# Patient Record
Sex: Female | Born: 1945 | Race: White | Hispanic: No | Smoking: Former smoker
Health system: Southern US, Community
[De-identification: ages and names within clinical notes are randomized; demographics above are authoritative.]

## PROBLEM LIST (undated history)

## (undated) DIAGNOSIS — Z9221 Personal history of antineoplastic chemotherapy: Secondary | ICD-10-CM

## (undated) DIAGNOSIS — I10 Essential (primary) hypertension: Secondary | ICD-10-CM

## (undated) DIAGNOSIS — Z923 Personal history of irradiation: Secondary | ICD-10-CM

## (undated) DIAGNOSIS — D649 Anemia, unspecified: Secondary | ICD-10-CM

## (undated) DIAGNOSIS — C801 Malignant (primary) neoplasm, unspecified: Secondary | ICD-10-CM

## (undated) HISTORY — DX: Malignant (primary) neoplasm, unspecified: C80.1

## (undated) HISTORY — DX: Essential (primary) hypertension: I10

## (undated) HISTORY — PX: LOBECTOMY: SHX5089

## (undated) HISTORY — PX: BREAST BIOPSY: SHX20

## (undated) HISTORY — DX: Anemia, unspecified: D64.9

## (undated) HISTORY — PX: BREAST EXCISIONAL BIOPSY: SUR124

---

## 1988-04-24 DIAGNOSIS — C50919 Malignant neoplasm of unspecified site of unspecified female breast: Secondary | ICD-10-CM

## 1988-04-24 HISTORY — DX: Malignant neoplasm of unspecified site of unspecified female breast: C50.919

## 1988-04-24 HISTORY — PX: BREAST LUMPECTOMY: SHX2

## 1993-04-24 HISTORY — PX: CHOLECYSTECTOMY: SHX55

## 2013-04-24 HISTORY — PX: APPENDECTOMY: SHX54

## 2014-04-24 HISTORY — PX: HERNIA REPAIR: SHX51

## 2015-04-25 HISTORY — PX: COLONOSCOPY: SHX174

## 2018-03-08 ENCOUNTER — Ambulatory Visit: Payer: Self-pay | Admitting: Cardiothoracic Surgery

## 2018-03-25 ENCOUNTER — Ambulatory Visit (INDEPENDENT_AMBULATORY_CARE_PROVIDER_SITE_OTHER): Payer: Medicare Other | Admitting: Cardiothoracic Surgery

## 2018-03-25 ENCOUNTER — Other Ambulatory Visit: Payer: Self-pay

## 2018-03-25 ENCOUNTER — Encounter: Payer: Self-pay | Admitting: Cardiothoracic Surgery

## 2018-03-25 VITALS — BP 157/98 | HR 96 | Temp 98.2°F | Ht 67.0 in | Wt 163.0 lb

## 2018-03-25 DIAGNOSIS — IMO0001 Reserved for inherently not codable concepts without codable children: Secondary | ICD-10-CM

## 2018-03-25 DIAGNOSIS — R911 Solitary pulmonary nodule: Secondary | ICD-10-CM

## 2018-03-25 NOTE — Patient Instructions (Addendum)
Return as needed.Ref to Dr. Dahlia Byes for left breast  breast . Get all ct scan and mammogram from [facility].  The patient is aware to call back for any questions or concerns.

## 2018-03-25 NOTE — Progress Notes (Signed)
Patient ID: Monica Baxter, female   DOB: 02/02/1946, 72 y.o.   MRN: [id number]  Chief Complaint  Patient presents with  . Other    Referred By Dr. Fulton Reek Reason for Referral possible recurrence at the previous left thoracotomy or thoracoscopy incision site HPI Location, Quality, Duration, Severity, Timing, Context, Modifying Factors, Associated Signs and Symptoms.  Monica Baxter is a 72 y.o. female.  She has a complicated history which begins about 20 years ago when she had a left lumpectomy followed by radiation therapy for invasive breast carcinoma.  This was in [place].  She had postoperative radiotherapy to that area.  About 10 years ago she underwent a right thoracotomy for a carcinoma of the lung again up in [place].  We do not have any information about those surgical procedures.  About 5 years ago she underwent a thoracoscopic lung resection at [facility] for what she says was a invasive carcinoma of the lung.  I do not have any official reports on any of those procedures.  She has been followed with serial CT scans and recently was found to have increased thickening along a previous surgical resection site on the left.  She initially thought that this was related to her lung surgery but upon reflection this is more related to her breast surgery from 20 years ago.  Regarding her recent lung surgery at [facility] she tolerated that without difficulty.  She is not short of breath.  She really has no specific complaints relative to her incisions.  She recently moved here from the [place] area and would like to transfer her care here.  She saw Dr. Doy Hutching with the most recent CT scan showing thickening along the incision along the left lateral breast which again was initially thought to represent a thoracoscopy scar but now is in the previous breast surgical scar.  She has had mammograms every year.  Those have been performed at [facility]  She is scheduled to have another mammogram done here in about 1  week.   Past Medical History:  Diagnosis Date  . Anemia   . Cancer [facility])    breast   . Hypertension     Past Surgical History:  Procedure Laterality Date  . APPENDECTOMY  2015  . CHOLECYSTECTOMY  1995  . COLONOSCOPY  2017  . HERNIA REPAIR  7893   umbilical   . LOBECTOMY Bilateral 8101,7510    History reviewed. No pertinent family history.  Social History Social History   Tobacco Use  . Smoking status: Former Smoker    Last attempt to quit: 1982    Years since quitting: 37.9  . Smokeless tobacco: Never Used  Substance Use Topics  . Alcohol use: Yes    Frequency: Never  . Drug use: Never    Allergies  Allergen Reactions  . Penicillins Other (See Comments) and Nausea And Vomiting    Unknown reaction as a child   . Codeine Itching    Within a day after initiating Tylenol 3 for cough suppression, she developed nasal itching and some itching in the central facial area. Within a day after initiating Tylenol 3 for cough suppression, she developed nasal itching and some itching in the central facial area.   . Hydromorphone Hcl Itching    Current Outpatient Medications  Medication Sig Dispense Refill  . amLODipine (NORVASC) 5 MG tablet Take by mouth.    . Cholecalciferol 25 MCG (1000 UT) CHEW Chew by mouth.    . gabapentin (NEURONTIN) 600 MG tablet  Take by mouth.    Marland Kitchen omeprazole (PRILOSEC) 20 MG capsule Take by mouth.    . tiotropium (SPIRIVA) 18 MCG inhalation capsule Place into inhaler and inhale.     No current facility-administered medications for this visit.       Review of Systems A complete review of systems was asked and was negative except for the following positive findings some shortness of breath.  Some reflux type symptoms as well as colonic polyps and a hiatal hernia.  Blood pressure (!) 157/98, pulse 96, temperature 98.2 F (36.8 C), temperature source Skin, height 5\' 7"  (1.702 m), weight 163 lb (73.9 kg), SpO2 97 %.  Physical  Exam CONSTITUTIONAL:  Pleasant, well-developed, well-nourished, and in no acute distress. EYES: Pupils equal and reactive to light, Sclera non-icteric EARS, NOSE, MOUTH AND THROAT:  The oropharynx was clear.  Dentition is good repair.  Oral mucosa pink and moist. LYMPH NODES:  Lymph nodes in the neck and axillae were normal RESPIRATORY:  Lungs were clear.  Normal respiratory effort without pathologic use of accessory muscles of respiration CARDIOVASCULAR: Heart was regular without murmurs.  There were no carotid bruits. GI: The abdomen was soft, nontender, and nondistended. There were no palpable masses. There was no hepatosplenomegaly. There were normal bowel sounds in all quadrants. GU:  Rectal deferred.   MUSCULOSKELETAL:  Normal muscle strength and tone.  No clubbing or cyanosis.   SKIN: On the right lateral chest wall there is a posterior lateral thoracotomy incision.  The incision itself looks fine without erythema, nodules or underlying soft tissue changes.  On the left breast superior to the areola is a 5 to 6 cm skin incision with some thickening under the lateral aspect of it.  There are 3 thoracoscopy ports on the left side as well all of which are free of any pathology. NEUROLOGIC:  Sensation is normal.  Cranial nerves are grossly intact. PSYCH:  Oriented to person, place and time.  Mood and affect are normal.  Data Reviewed CT scan  I have personally reviewed the patient's imaging, laboratory findings and medical records.    Assessment/Plan    Initially I was able to review the CT scan from [facility].  Unfortunately I could not see the scan at its full resolution and I only had access to 1 of the scans.  We will therefore get the scans sent to [place] from [facility] to be digitized into our system for further review.  I have explained to her that I thought that the area in question on the CT scan report was actually referring to her breast as that was the only pathologic finding on physical exam.  I  have explained to her that I would like her to see a general surgeon after her mammogram and she is agreeable to doing that.  In addition I would like to see her back again in 1 year so that we can continue to follow the 2 lung cancers that she has had resected.   We will schedule her for repeat chest CT in 1 year with a visit to follow after that.  We will refer her to general surgery for her abnormal breast exam once her mammogram is complete.  Nestor Lewandowsky, MD 03/25/2018, 10:53 AM

## 2018-04-03 ENCOUNTER — Ambulatory Visit
Admission: RE | Admit: 2018-04-03 | Discharge: 2018-04-03 | Disposition: A | Discharge status: Home / Self Care | Payer: Self-pay | Source: Ambulatory Visit | Attending: Cardiothoracic Surgery | Admitting: Cardiothoracic Surgery

## 2018-04-03 ENCOUNTER — Other Ambulatory Visit: Payer: Self-pay | Admitting: Cardiothoracic Surgery

## 2018-04-03 DIAGNOSIS — C3490 Malignant neoplasm of unspecified part of unspecified bronchus or lung: Secondary | ICD-10-CM

## 2018-04-08 ENCOUNTER — Ambulatory Visit: Payer: Medicare Other | Admitting: Surgery

## 2018-04-15 ENCOUNTER — Ambulatory Visit: Payer: Medicare Other | Admitting: Surgery

## 2018-09-20 ENCOUNTER — Other Ambulatory Visit (HOSPITAL_COMMUNITY): Payer: Self-pay | Admitting: Physician Assistant

## 2018-09-20 ENCOUNTER — Other Ambulatory Visit: Payer: Self-pay

## 2018-09-20 ENCOUNTER — Other Ambulatory Visit: Payer: Self-pay | Admitting: Physician Assistant

## 2018-09-20 ENCOUNTER — Ambulatory Visit
Admission: RE | Admit: 2018-09-20 | Discharge: 2018-09-20 | Disposition: A | Discharge status: Home / Self Care | Payer: Medicare Other | Source: Ambulatory Visit | Attending: Physician Assistant | Admitting: Physician Assistant

## 2018-09-20 DIAGNOSIS — R1032 Left lower quadrant pain: Secondary | ICD-10-CM | POA: Insufficient documentation

## 2018-09-20 MED ORDER — IOHEXOL 300 MG/ML  SOLN
100.0000 mL | Freq: Once | INTRAMUSCULAR | Status: AC | PRN
  Administered 2018-09-20: 100 mL via INTRAVENOUS

## 2018-09-25 ENCOUNTER — Ambulatory Visit: Payer: Medicare Other

## 2018-09-27 ENCOUNTER — Other Ambulatory Visit: Payer: Self-pay | Admitting: Internal Medicine

## 2018-09-27 DIAGNOSIS — C3412 Malignant neoplasm of upper lobe, left bronchus or lung: Secondary | ICD-10-CM

## 2018-09-27 DIAGNOSIS — J449 Chronic obstructive pulmonary disease, unspecified: Secondary | ICD-10-CM

## 2018-12-27 ENCOUNTER — Other Ambulatory Visit: Payer: Self-pay

## 2018-12-27 ENCOUNTER — Ambulatory Visit
Admission: RE | Admit: 2018-12-27 | Discharge: 2018-12-27 | Disposition: A | Discharge status: Home / Self Care | Payer: Medicare Other | Source: Ambulatory Visit | Attending: Internal Medicine | Admitting: Internal Medicine

## 2018-12-27 DIAGNOSIS — C3412 Malignant neoplasm of upper lobe, left bronchus or lung: Secondary | ICD-10-CM | POA: Insufficient documentation

## 2018-12-27 DIAGNOSIS — J449 Chronic obstructive pulmonary disease, unspecified: Secondary | ICD-10-CM

## 2019-03-19 ENCOUNTER — Telehealth: Payer: Self-pay

## 2019-03-19 ENCOUNTER — Other Ambulatory Visit: Payer: Self-pay

## 2019-03-19 DIAGNOSIS — R911 Solitary pulmonary nodule: Secondary | ICD-10-CM

## 2019-03-19 DIAGNOSIS — IMO0001 Reserved for inherently not codable concepts without codable children: Secondary | ICD-10-CM

## 2019-03-19 DIAGNOSIS — G8912 Acute post-thoracotomy pain: Secondary | ICD-10-CM

## 2019-03-19 NOTE — Telephone Encounter (Signed)
CT chest scheduled at [facility] 03/24/2019. Nothing by mouth 4 hours prior.   Dr.Oaks 03/28/2019 @ 9:45.  Patient verbalized understanding of the appointments listed above.

## 2019-03-24 ENCOUNTER — Ambulatory Visit: Payer: Medicare Other

## 2019-03-28 ENCOUNTER — Ambulatory Visit: Payer: Medicare Other | Admitting: Cardiothoracic Surgery

## 2019-04-01 ENCOUNTER — Ambulatory Visit: Payer: Medicare Other

## 2019-04-04 ENCOUNTER — Telehealth: Payer: Medicare Other | Admitting: Cardiothoracic Surgery

## 2019-07-18 ENCOUNTER — Ambulatory Visit: Payer: Medicare Other | Admitting: Urology

## 2019-08-06 ENCOUNTER — Encounter (INDEPENDENT_AMBULATORY_CARE_PROVIDER_SITE_OTHER): Payer: Self-pay

## 2019-08-06 ENCOUNTER — Encounter: Payer: Self-pay | Admitting: Urology

## 2019-08-06 ENCOUNTER — Other Ambulatory Visit: Payer: Self-pay

## 2019-08-06 ENCOUNTER — Ambulatory Visit (INDEPENDENT_AMBULATORY_CARE_PROVIDER_SITE_OTHER): Payer: Medicare Other | Admitting: Urology

## 2019-08-06 VITALS — BP 153/84 | HR 108 | Ht 66.0 in | Wt 155.0 lb

## 2019-08-06 DIAGNOSIS — R399 Unspecified symptoms and signs involving the genitourinary system: Secondary | ICD-10-CM | POA: Diagnosis not present

## 2019-08-06 DIAGNOSIS — R8271 Bacteriuria: Secondary | ICD-10-CM | POA: Diagnosis not present

## 2019-08-06 DIAGNOSIS — N393 Stress incontinence (female) (male): Secondary | ICD-10-CM | POA: Diagnosis not present

## 2019-08-06 LAB — URINALYSIS, COMPLETE
Bilirubin, UA: NEGATIVE
Glucose, UA: NEGATIVE
Ketones, UA: NEGATIVE
Leukocytes,UA: NEGATIVE
Nitrite, UA: NEGATIVE
Protein,UA: NEGATIVE
RBC, UA: NEGATIVE
Specific Gravity, UA: 1.01 (ref 1.005–1.030)
Urobilinogen, Ur: 0.2 mg/dL (ref 0.2–1.0)
pH, UA: 5.5 (ref 5.0–7.5)

## 2019-08-06 LAB — BLADDER SCAN AMB NON-IMAGING: Scan Result: 0

## 2019-08-06 LAB — MICROSCOPIC EXAMINATION
Bacteria, UA: NONE SEEN
Epithelial Cells (non renal): NONE SEEN /hpf (ref 0–10)
RBC, Urine: NONE SEEN /hpf (ref 0–2)

## 2019-08-06 NOTE — Progress Notes (Signed)
08/06/19 9:52 AM   Monica Baxter [birth year] [id number]  Referring provider: Idelle Crouch, MD [facility] [place],  [place] [address]  Chief Complaint  Patient presents with  . Urinary Tract Infection    HPI: Monica Baxter is a 74 y.o. F who presents today for the evaluation and management of rUTIs.   PCP visit with Dr. Doy Hutching on 05/15/19 where she had bacteria in urine however had no UTI symptoms.   Most recent UA form 06/30/19 revealed rare bacteria otherwise unremarkable. UAs from 03/2019 and 08/2018 indicated positive nitrite and many bacteria. Associated positive urine cultures below.   She was treated with Cipro.   She reports of previous UTIs with no symptoms. Denies burning with urination.  She also reports of mild leakage when coughing, sneezing, and laughing. Her pads are not saturated. This is somewhat bothersome and new since last year.   + Urine culture 03/31/2019 indicative of K. Pneumoniae resistant to Ampicillin  03/05/2018 indicative of E. Coli   PMH: Past Medical History:  Diagnosis Date  . Anemia   . Cancer [facility])    breast   . Hypertension     Surgical History: Past Surgical History:  Procedure Laterality Date  . APPENDECTOMY  2015  . CHOLECYSTECTOMY  1995  . COLONOSCOPY  2017  . HERNIA REPAIR  4287   umbilical   . LOBECTOMY Bilateral 2002,2015    Home Medications:  Allergies as of 08/06/2019      Reactions   Penicillins Other (See Comments), Nausea And Vomiting   Unknown reaction as a child   Codeine Itching   Within a day after initiating Tylenol 3 for cough suppression, she developed nasal itching and some itching in the central facial area. Within a day after initiating Tylenol 3 for cough suppression, she developed nasal itching and some itching in the central facial area.   Hydromorphone Hcl Itching      Medication List       Accurate as of August 06, 2019 11:59 PM. If you have any questions,  ask your nurse or doctor.        amLODipine 10 MG tablet Commonly known as: NORVASC What changed: Another medication with the same name was removed. Continue taking this medication, and follow the directions you see here. Changed by: Hollice Espy, MD   Cholecalciferol 25 MCG (1000 UT) Chew Chew by mouth.   gabapentin 600 MG tablet Commonly known as: NEURONTIN Take by mouth.   omeprazole 20 MG capsule Commonly known as: PRILOSEC Take by mouth.   raloxifene 60 MG tablet Commonly known as: EVISTA   solifenacin 5 MG tablet Commonly known as: VESICARE   tiotropium 18 MCG inhalation capsule Commonly known as: [place] into inhaler and inhale.       Allergies:  Allergies  Allergen Reactions  . Penicillins Other (See Comments) and Nausea And Vomiting    Unknown reaction as a child   . Codeine Itching    Within a day after initiating Tylenol 3 for cough suppression, she developed nasal itching and some itching in the central facial area. Within a day after initiating Tylenol 3 for cough suppression, she developed nasal itching and some itching in the central facial area.   . Hydromorphone Hcl Itching    Family History: Family History  Adopted: Yes  Family history unknown: Yes    Social History:  reports that she quit smoking about 39 years ago. She has never used smokeless tobacco.  She reports current alcohol use. She reports that she does not use drugs.   Physical Exam: BP (!) 153/84   Pulse (!) 108   Ht 5\' 6"  (1.676 m)   Wt 155 lb (70.3 kg)   BMI 25.02 kg/m   Constitutional:  Alert and oriented, No acute distress. HEENT: [place] AT, moist mucus membranes.  Trachea midline, no masses. Cardiovascular: No clubbing, cyanosis, or edema. Respiratory: Normal respiratory effort, no increased work of breathing. Skin: No rashes, bruises or suspicious lesions. Neurologic: Grossly intact, no focal deficits, moving all 4 extremities. Psychiatric: Normal mood and  affect.  Pertinent Imaging: Results for orders placed or performed in visit on 08/06/19  Microscopic Examination   URINE  Result Value Ref Range   WBC, UA 0-5 0 - 5 /hpf   RBC None seen 0 - 2 /hpf   Epithelial Cells (non renal) None seen 0 - 10 /hpf   Bacteria, UA None seen None seen/Few  Urinalysis, Complete  Result Value Ref Range   Specific Gravity, UA 1.010 1.005 - 1.030   pH, UA 5.5 5.0 - 7.5   Color, UA Yellow Yellow   Appearance Ur Clear Clear   Leukocytes,UA Negative Negative   Protein,UA Negative Negative/Trace   Glucose, UA Negative Negative   Ketones, UA Negative Negative   RBC, UA Negative Negative   Bilirubin, UA Negative Negative   Urobilinogen, Ur 0.2 0.2 - 1.0 mg/dL   Nitrite, UA Negative Negative   Microscopic Examination See below:   BLADDER SCAN AMB NON-IMAGING  Result Value Ref Range   Scan Result 0 ML    Assessment & Plan:    1. Chronic intermittent asymptomatic bacteriuria  Asymptomatic  Treat for symptoms only -spent a significant amount of time counseling the patient Counseled pt on prevention techniques including probiotics and cranberry tablets  Advised to return to our office specifically if she has any UTI type symptoms F/u prn   2. Stress incontinence  Adequate emptying of bladder  Discussed conservative and behavioral modifications including Kegel exercises and weight loss She may may be interested in physical therapy referral if she is unable to do these exercises independently, she will let us know Not bother her enough to consider procedure versus surgical intervention at this time  Return if symptoms worsen or fail to improve.  [facility] [address] [place], [place] [address] [phone]  I, Lucas Mallow, am acting as a scribe for Dr. Hollice Espy,  I have reviewed the above documentation for accuracy and completeness, and I agree with the above.   Hollice Espy, MD   I  spent 45 total minutes on the day of the encounter including pre-visit review of the medical record, face-to-face time with the patient, and post visit ordering of labs/imaging/tests.

## 2019-08-06 NOTE — Patient Instructions (Signed)
Pelvic Floor Muscle Exercises  More commonly called "Kegel" exercises: they can strengthen the muscles that hold urine inside the bladder.  Here's how you do them:   - Imagine that you are trying to control passing gas   - Pull in or tighten your pelvic muscles and hold for a count of 3 (you should feel a lifting sensation in the area around your vagina or pulling in your rectum)   - Repeat 10 to 15 times, at least 3 times a day   - Each time you do these exercises, alternate your position between lying, sitting and standing  Talk to you health care professional to make sure you are doing the exercise the right way.     Lie with hips and knees bent.  On an exhale, squeeze anal sphincter and hold for 3 seconds.  Release and fully relax pelvic floor.  Repeat 3 times.  Perform one set each hour during the day.  Also in this position, lie with hips and knees bent.  Slowly inhale, and then exhale.  On the exhale, contract anal sphincter, pull navel to spine and contract buttocks.  Hold for 5 seconds.  Repeat 5 times.  Perform one set each hour of the day in either lying, seated or standing.  While still laying lie with hips and knees bent. Slowly inhale and then exhale while pulling navel toward spine, holding 5 seconds.  Relax, repeat 5 times.  Perform one set each hour daytime in any position lying seated or standing.  Sitting on an exhale squeeze anal sphincter and hold for 3 seconds.  Release and fully relax pelvic floor.  Repeat 3 times.  Perform one set each hour daytime.  Standing on an exhale squeeze anal sphincter and hold for 3 seconds.  Release and fully relax pelvic floor.  Repeat 3 times.  Perform one set each hour daytime.

## 2019-11-02 ENCOUNTER — Encounter: Payer: Self-pay | Admitting: Emergency Medicine

## 2019-11-02 ENCOUNTER — Other Ambulatory Visit: Payer: Self-pay

## 2019-11-02 ENCOUNTER — Emergency Department
Admission: EM | Admit: 2019-11-02 | Discharge: 2019-11-02 | Disposition: A | Discharge status: Home / Self Care | Payer: Medicare Other | Attending: Emergency Medicine | Admitting: Emergency Medicine

## 2019-11-02 ENCOUNTER — Emergency Department: Payer: Medicare Other

## 2019-11-02 DIAGNOSIS — R079 Chest pain, unspecified: Secondary | ICD-10-CM | POA: Diagnosis not present

## 2019-11-02 DIAGNOSIS — Z5321 Procedure and treatment not carried out due to patient leaving prior to being seen by health care provider: Secondary | ICD-10-CM | POA: Insufficient documentation

## 2019-11-02 LAB — CBC
HCT: 35.5 % — ABNORMAL LOW (ref 36.0–46.0)
Hemoglobin: 11.8 g/dL — ABNORMAL LOW (ref 12.0–15.0)
MCH: 27.8 pg (ref 26.0–34.0)
MCHC: 33.2 g/dL (ref 30.0–36.0)
MCV: 83.5 fL (ref 80.0–100.0)
Platelets: 339 10*3/uL (ref 150–400)
RBC: 4.25 MIL/uL (ref 3.87–5.11)
RDW: 13.1 % (ref 11.5–15.5)
WBC: 7.1 10*3/uL (ref 4.0–10.5)
nRBC: 0 % (ref 0.0–0.2)

## 2019-11-02 LAB — BASIC METABOLIC PANEL
Anion gap: 8 (ref 5–15)
BUN: 16 mg/dL (ref 8–23)
CO2: 29 mmol/L (ref 22–32)
Calcium: 9.3 mg/dL (ref 8.9–10.3)
Chloride: 104 mmol/L (ref 98–111)
Creatinine, Ser: 0.64 mg/dL (ref 0.44–1.00)
GFR calc Af Amer: >60 mL/min (ref >60)
GFR calc non Af Amer: >60 mL/min (ref >60)
Glucose, Bld: 113 mg/dL — ABNORMAL HIGH (ref 70–99)
Potassium: 4 mmol/L (ref 3.5–5.1)
Sodium: 141 mmol/L (ref 135–145)

## 2019-11-02 LAB — TROPONIN I (HIGH SENSITIVITY): Troponin I (High Sensitivity): 2 ng/L (ref <18)

## 2019-11-02 NOTE — ED Triage Notes (Signed)
Pt arrives POV to triage with c/o chest pain since midnight. Pt states that she had pain in June but was able to increase her Prilosec and help the pain. Pt states that she has a lot of gas and thinks that this is probably acid reflux related.

## 2019-11-02 NOTE — ED Notes (Signed)
Pt informed registration desk she would be leaving and that she felt better and will follow up with PCP. This RN did not speak with patient prior to departure.

## 2020-01-20 ENCOUNTER — Inpatient Hospital Stay
Admission: RE | Admit: 2020-01-20 | Discharge: 2020-01-20 | Disposition: A | Discharge status: Home / Self Care | Payer: Self-pay | Source: Ambulatory Visit | Attending: *Deleted | Admitting: *Deleted

## 2020-01-20 ENCOUNTER — Other Ambulatory Visit: Payer: Self-pay | Admitting: *Deleted

## 2020-01-20 DIAGNOSIS — Z1231 Encounter for screening mammogram for malignant neoplasm of breast: Secondary | ICD-10-CM

## 2020-02-03 ENCOUNTER — Other Ambulatory Visit: Payer: Self-pay | Admitting: Internal Medicine

## 2020-02-03 DIAGNOSIS — Z1231 Encounter for screening mammogram for malignant neoplasm of breast: Secondary | ICD-10-CM

## 2020-05-14 ENCOUNTER — Other Ambulatory Visit: Payer: Self-pay

## 2020-05-14 ENCOUNTER — Ambulatory Visit
Admission: RE | Admit: 2020-05-14 | Discharge: 2020-05-14 | Disposition: A | Discharge status: Home / Self Care | Payer: Medicare Other | Source: Ambulatory Visit | Attending: Internal Medicine | Admitting: Internal Medicine

## 2020-05-14 DIAGNOSIS — Z1231 Encounter for screening mammogram for malignant neoplasm of breast: Secondary | ICD-10-CM | POA: Insufficient documentation

## 2020-05-14 HISTORY — DX: Personal history of irradiation: Z92.3

## 2020-05-14 HISTORY — DX: Personal history of antineoplastic chemotherapy: Z92.21

## 2020-05-20 ENCOUNTER — Other Ambulatory Visit: Payer: Self-pay | Admitting: Internal Medicine

## 2020-05-20 DIAGNOSIS — N6489 Other specified disorders of breast: Secondary | ICD-10-CM

## 2020-05-20 DIAGNOSIS — R928 Other abnormal and inconclusive findings on diagnostic imaging of breast: Secondary | ICD-10-CM

## 2020-05-31 ENCOUNTER — Other Ambulatory Visit: Payer: Self-pay

## 2020-05-31 ENCOUNTER — Ambulatory Visit
Admission: RE | Admit: 2020-05-31 | Discharge: 2020-05-31 | Disposition: A | Discharge status: Home / Self Care | Payer: Medicare Other | Source: Ambulatory Visit | Attending: Internal Medicine | Admitting: Internal Medicine

## 2020-05-31 DIAGNOSIS — R928 Other abnormal and inconclusive findings on diagnostic imaging of breast: Secondary | ICD-10-CM | POA: Diagnosis not present

## 2020-05-31 DIAGNOSIS — N6489 Other specified disorders of breast: Secondary | ICD-10-CM

## 2021-03-29 ENCOUNTER — Other Ambulatory Visit: Payer: Self-pay | Admitting: Internal Medicine

## 2021-03-29 DIAGNOSIS — Z1231 Encounter for screening mammogram for malignant neoplasm of breast: Secondary | ICD-10-CM

## 2021-06-06 ENCOUNTER — Ambulatory Visit
Admission: RE | Admit: 2021-06-06 | Discharge: 2021-06-06 | Disposition: A | Discharge status: Home / Self Care | Payer: Medicare Other | Source: Ambulatory Visit | Attending: Internal Medicine | Admitting: Internal Medicine

## 2021-06-06 ENCOUNTER — Other Ambulatory Visit: Payer: Self-pay

## 2021-06-06 DIAGNOSIS — Z1231 Encounter for screening mammogram for malignant neoplasm of breast: Secondary | ICD-10-CM | POA: Insufficient documentation

## 2021-06-09 IMAGING — MG MM DIGITAL DIAGNOSTIC UNILAT*L* W/ TOMO W/ CAD
6 of 10 series · 6 of 30 positions shown · non-contrast
Comparison: Previous exam(s).

CLINICAL DATA: Patient was recalled from screening mammogram for
possible asymmetry in the left breast.

EXAM:
DIGITAL DIAGNOSTIC UNILATERAL LEFT MAMMOGRAM WITH TOMOSYNTHESIS AND
CAD
TECHNIQUE: Left digital diagnostic mammography and breast tomosynthesis was
performed. The images were evaluated with computer-aided detection.

[L MLO synth-2D (1 of 3)]
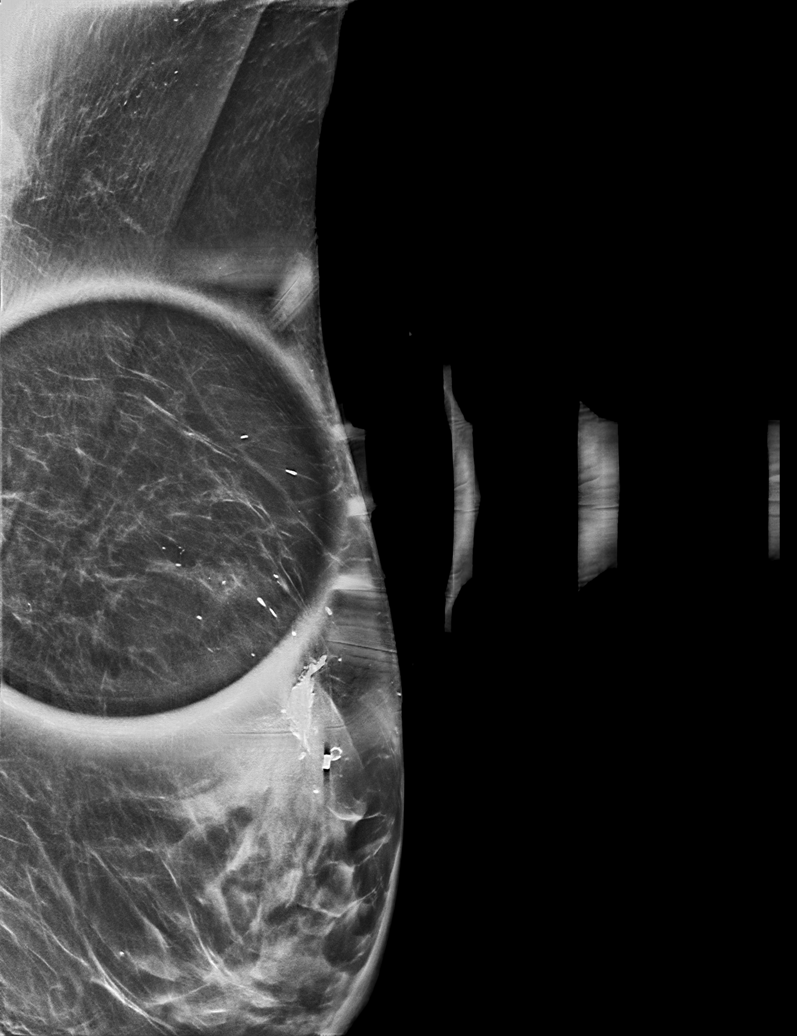

[L MLO synth-2D (2 of 3)]
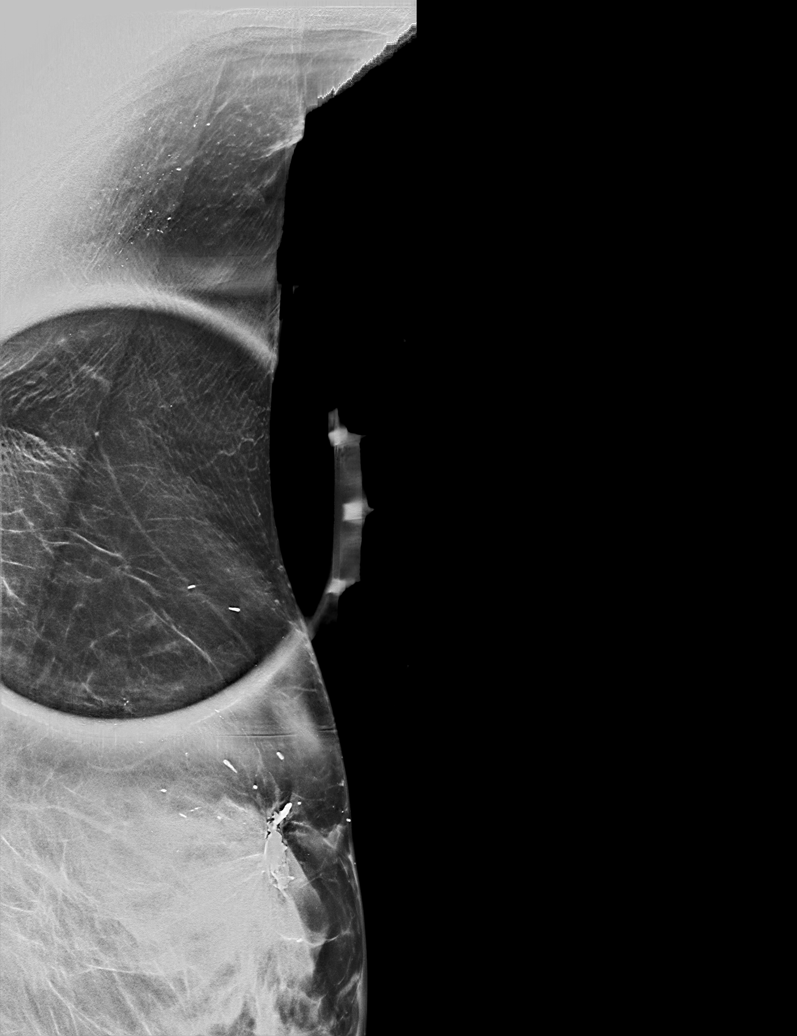

[L LM synth-2D]
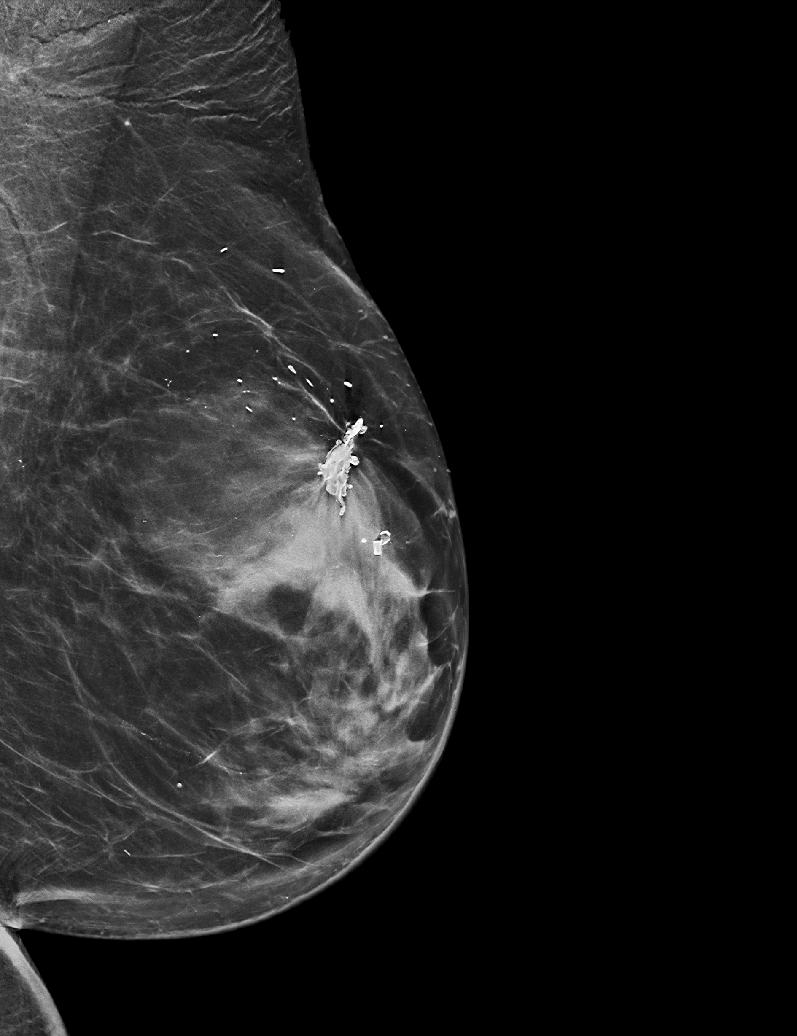

[L MLO synth-2D (3 of 3)]
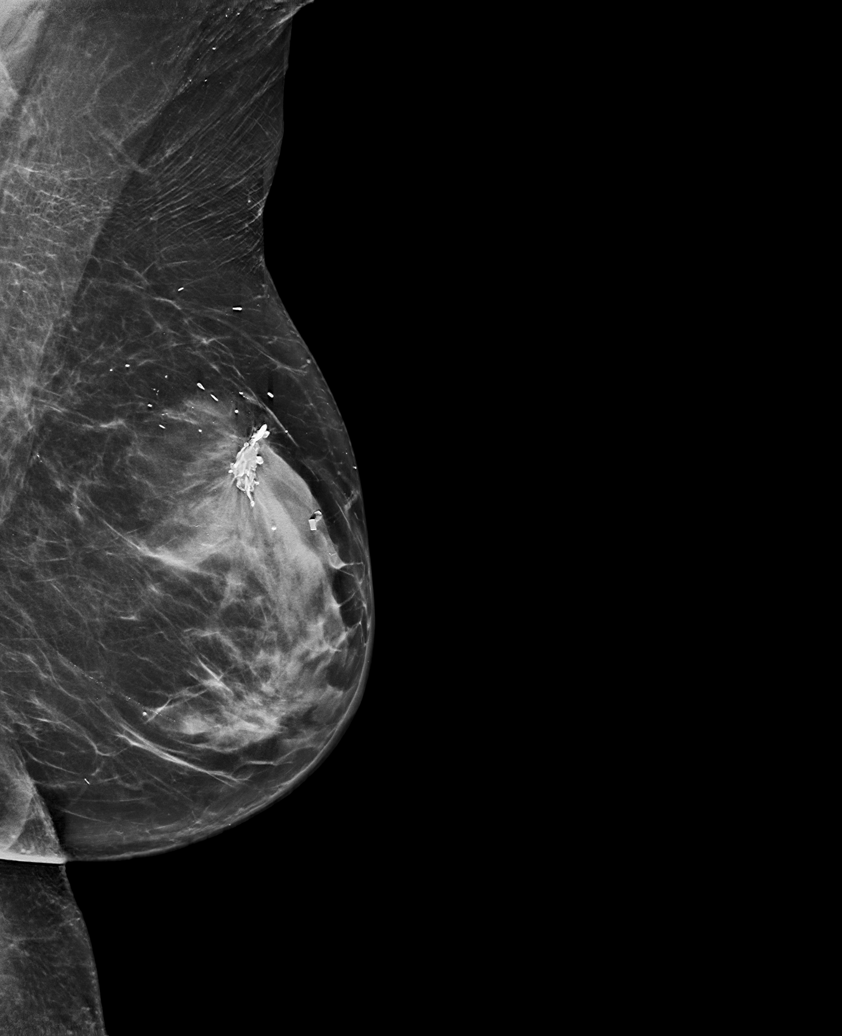

[L ML synth-2D]
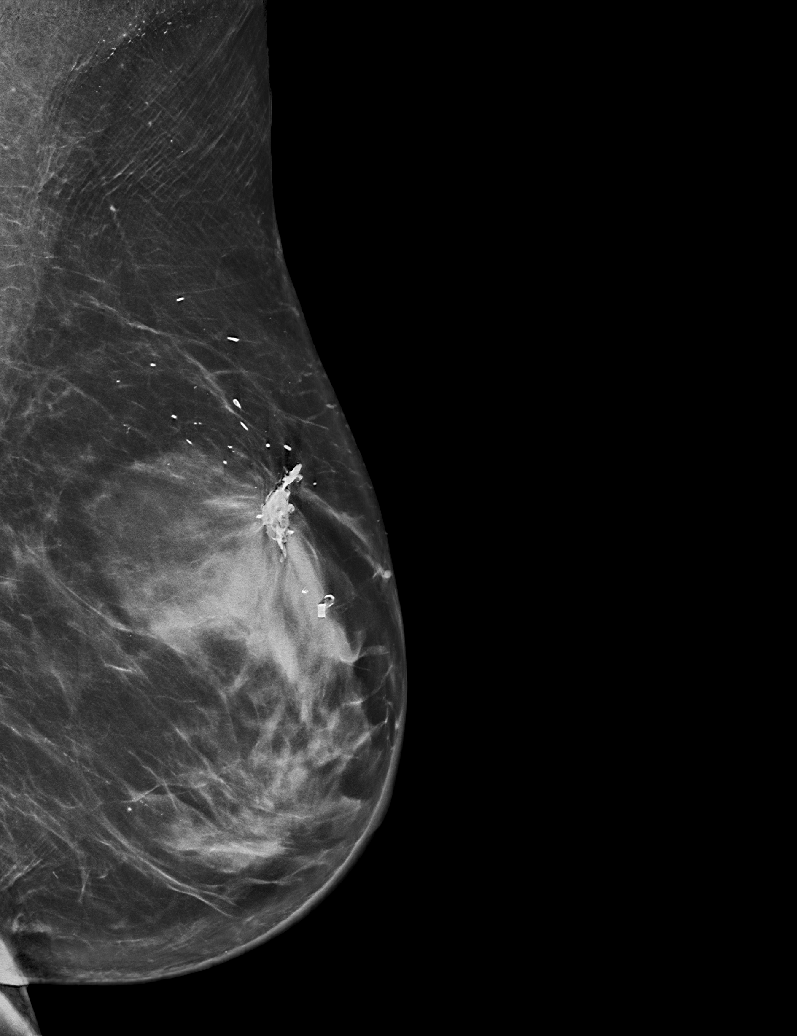

[L LM tomo · tomo slice 35/68.0]
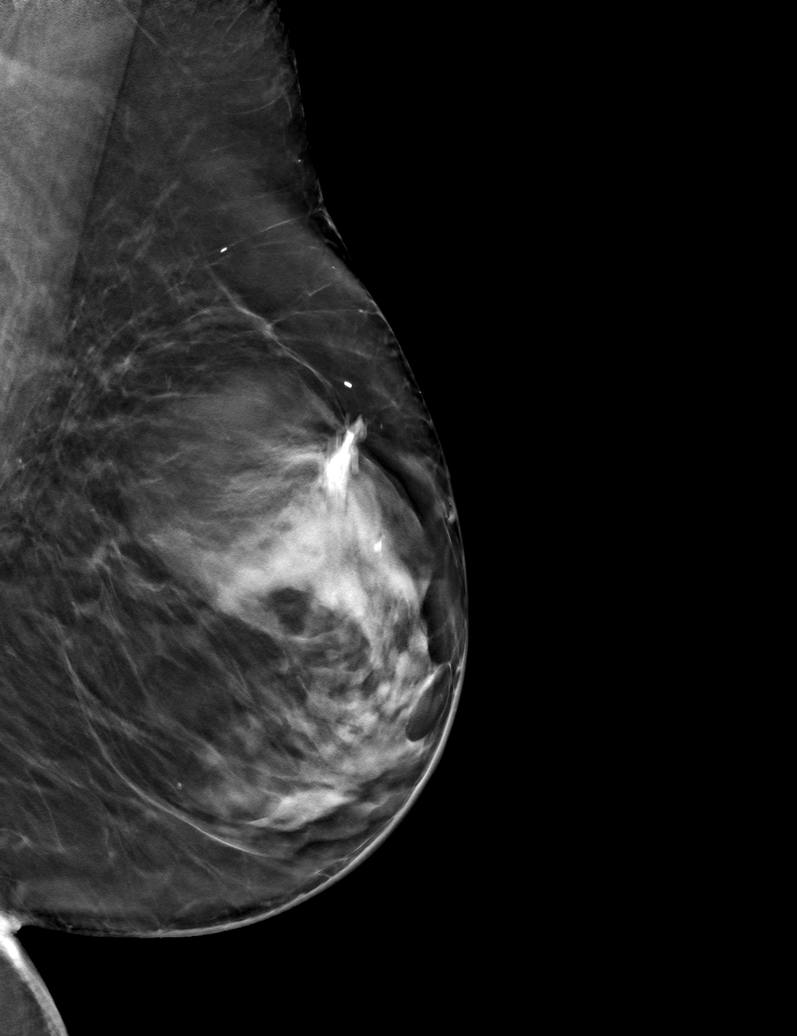

[6 of 30 positions shown; findings below may reference images not displayed]

ACR Breast Density Category c: The breast tissue is heterogeneously
dense, which may obscure small masses.
FINDINGS: Stable lumpectomy changes are seen in the left breast. The asymmetry
in the superior aspect of the breast does not persist on the
additional imaging. No suspicious mass, malignant type
microcalcifications or distortion detected.
IMPRESSION: No evidence of malignancy in the left breast.

RECOMMENDATION:
Bilateral screening mammogram in 1 year is recommended.

I have discussed the findings and recommendations with the patient.
If applicable, a reminder letter will be sent to the patient
regarding the next appointment.

BI-RADS CATEGORY  2: Benign.

## 2022-03-21 ENCOUNTER — Other Ambulatory Visit: Payer: Self-pay | Admitting: Internal Medicine

## 2022-03-21 DIAGNOSIS — Z1231 Encounter for screening mammogram for malignant neoplasm of breast: Secondary | ICD-10-CM

## 2022-04-03 ENCOUNTER — Inpatient Hospital Stay: Payer: Medicare Other

## 2022-04-03 ENCOUNTER — Inpatient Hospital Stay: Payer: Medicare Other | Attending: Internal Medicine | Admitting: Internal Medicine

## 2022-04-03 ENCOUNTER — Encounter: Payer: Self-pay | Admitting: Internal Medicine

## 2022-04-03 VITALS — BP 146/89 | HR 87 | Temp 97.1°F | Wt 156.0 lb

## 2022-04-03 DIAGNOSIS — I10 Essential (primary) hypertension: Secondary | ICD-10-CM | POA: Diagnosis not present

## 2022-04-03 DIAGNOSIS — D696 Thrombocytopenia, unspecified: Secondary | ICD-10-CM

## 2022-04-03 DIAGNOSIS — Z87891 Personal history of nicotine dependence: Secondary | ICD-10-CM | POA: Diagnosis not present

## 2022-04-03 DIAGNOSIS — D649 Anemia, unspecified: Secondary | ICD-10-CM | POA: Insufficient documentation

## 2022-04-03 DIAGNOSIS — Z9221 Personal history of antineoplastic chemotherapy: Secondary | ICD-10-CM | POA: Diagnosis not present

## 2022-04-03 DIAGNOSIS — Z79899 Other long term (current) drug therapy: Secondary | ICD-10-CM | POA: Diagnosis not present

## 2022-04-03 DIAGNOSIS — Z923 Personal history of irradiation: Secondary | ICD-10-CM | POA: Insufficient documentation

## 2022-04-03 DIAGNOSIS — Z85118 Personal history of other malignant neoplasm of bronchus and lung: Secondary | ICD-10-CM | POA: Diagnosis not present

## 2022-04-03 DIAGNOSIS — Z853 Personal history of malignant neoplasm of breast: Secondary | ICD-10-CM | POA: Insufficient documentation

## 2022-04-03 DIAGNOSIS — Z9012 Acquired absence of left breast and nipple: Secondary | ICD-10-CM | POA: Diagnosis not present

## 2022-04-03 LAB — CBC WITH DIFFERENTIAL/PLATELET
Abs Immature Granulocytes: 0.02 10*3/uL (ref 0.00–0.07)
Basophils Absolute: 0 10*3/uL (ref 0.0–0.1)
Basophils Relative: 1 %
Eosinophils Absolute: 0.3 10*3/uL (ref 0.0–0.5)
Eosinophils Relative: 5 %
HCT: 35.3 % — ABNORMAL LOW (ref 36.0–46.0)
Hemoglobin: 11.5 g/dL — ABNORMAL LOW (ref 12.0–15.0)
Immature Granulocytes: 0 %
Lymphocytes Relative: 30 %
Lymphs Abs: 1.7 10*3/uL (ref 0.7–4.0)
MCH: 27.4 pg (ref 26.0–34.0)
MCHC: 32.6 g/dL (ref 30.0–36.0)
MCV: 84 fL (ref 80.0–100.0)
Monocytes Absolute: 0.4 10*3/uL (ref 0.1–1.0)
Monocytes Relative: 7 %
Neutro Abs: 3.2 10*3/uL (ref 1.7–7.7)
Neutrophils Relative %: 57 %
Platelets: 309 10*3/uL (ref 150–400)
RBC: 4.2 MIL/uL (ref 3.87–5.11)
RDW: 13.2 % (ref 11.5–15.5)
WBC: 5.6 10*3/uL (ref 4.0–10.5)
nRBC: 0 % (ref 0.0–0.2)

## 2022-04-03 LAB — IRON AND TIBC
Iron: 59 ug/dL (ref 28–170)
Saturation Ratios: 15 % (ref 10.4–31.8)
TIBC: 382 ug/dL (ref 250–450)
UIBC: 323 ug/dL

## 2022-04-03 LAB — TECHNOLOGIST SMEAR REVIEW: Plt Morphology: ADEQUATE

## 2022-04-03 LAB — RETICULOCYTES
Immature Retic Fract: 8.3 % (ref 2.3–15.9)
RBC.: 4.19 MIL/uL (ref 3.87–5.11)
Retic Count, Absolute: 60.8 10*3/uL (ref 19.0–186.0)
Retic Ct Pct: 1.5 % (ref 0.4–3.1)

## 2022-04-03 LAB — LACTATE DEHYDROGENASE: LDH: 118 U/L (ref 98–192)

## 2022-04-03 LAB — FERRITIN: Ferritin: 23 ng/mL (ref 11–307)

## 2022-04-03 LAB — FOLATE: Folate: 13.9 ng/mL (ref >5.9)

## 2022-04-03 LAB — VITAMIN B12: Vitamin B-12: 594 pg/mL (ref 180–914)

## 2022-04-03 NOTE — Progress Notes (Signed)
Patient here as new patient for Anemia. Per patient her recent labs showed her hgb was low. Patient states she always tired and feet stay cold. Per patient she has little energy. Patient wanting Dr. Sharmaine Base opinion of labs.

## 2022-04-03 NOTE — Progress Notes (Signed)
[facility] NOTE  Patient Care Team: Idelle Crouch, MD as PCP - General (Internal Medicine)  CHIEF COMPLAINTS/PURPOSE OF CONSULTATION: ANEMIA   HEMATOLOGY HISTORY  # 1990- left breast cancer- s/p lumpectomy; chemo- RT; no pills [ER- NEG]; Orp[facility] Greek.   # Right Upper Lung cancer [2000] s/p surgery- Orpah Greek- No chemo- RT [Stage I]; remote smoking..   # ANEMIA[Hb; MCV-platelets- WBC; Iron sat; ferritin;  GFR- CT/US; EGD- none/colonoscopy-2023-Feb- NEG  HISTORY OF PRESENTING ILLNESS: Alone. Ambulating independently.  Monica Baxter 76 y.o.  female pleasant patient was been referred to [place] for further evaluation of anemia.  Patient admits to chronic mild fatigue.  Blood in stools:none Blood in urine:none Difficulty swallowing:none Change of bowel movement/constipation:none Prior blood transfusion:none Prior history of blood loss: none Liver disease:none Alcohol: none Bariatric surgery:none  Vaginal bleeding: none Prior evaluation with hematology: none Prior bone marrow biopsy: none Oral iron: none Prior IV iron infusions:none   Review of Systems  Constitutional:  Positive for malaise/fatigue. Negative for chills, diaphoresis, fever and weight loss.  HENT:  Negative for nosebleeds and sore throat.   Eyes:  Negative for double vision.  Respiratory:  Negative for cough, hemoptysis, sputum production, shortness of breath and wheezing.   Cardiovascular:  Negative for chest pain, palpitations, orthopnea and leg swelling.  Gastrointestinal:  Negative for abdominal pain, blood in stool, constipation, diarrhea, heartburn, melena, nausea and vomiting.  Genitourinary:  Negative for dysuria, frequency and urgency.  Musculoskeletal:  Negative for back pain and joint pain.  Skin: Negative.  Negative for itching and rash.  Neurological:  Negative for dizziness, tingling, focal weakness, weakness and headaches.  Endo/Heme/Allergies:  Does not bruise/bleed  easily.  Psychiatric/Behavioral:  Negative for depression. The patient is not nervous/anxious and does not have insomnia.     MEDICAL HISTORY:  Past Medical History:  Diagnosis Date   Anemia    Breast cancer ([place]) 1990   left   Cancer ([place])    breast    Hypertension    Personal history of chemotherapy    Personal history of radiation therapy     SURGICAL HISTORY: Past Surgical History:  Procedure Laterality Date   APPENDECTOMY  2015   BREAST BIOPSY     BREAST EXCISIONAL BIOPSY     BREAST LUMPECTOMY Left [place]   COLONOSCOPY  2017   HERNIA REPAIR  2542   umbilical    LOBECTOMY Bilateral 2002,2015    SOCIAL HISTORY: Social History   Socioeconomic History   Marital status: Married    Spouse name: Not on file   Number of children: Not on file   Years of education: Not on file   Highest education level: Not on file  Occupational History   Not on file  Tobacco Use   Smoking status: Former    Types: Cigarettes    Quit date: 1982    Years since quitting: 41.9   Smokeless tobacco: Never  Substance and Sexual Activity   Alcohol use: Yes    Alcohol/week: 2.0 standard drinks of alcohol    Types: 2 Glasses of wine per week   Drug use: Never   Sexual activity: Not on file  Other Topics Concern   Not on file  Social History Narrative   Not on file   Social Determinants of Health   Financial Resource Strain: Not on file  Food Insecurity: Not on file  Transportation Needs: Not on file  Physical Activity: Not on file  Stress: Not on file  Social Connections: Not on file  Intimate Partner Violence: Not on file    FAMILY HISTORY: Family History  Adopted: Yes  Family history unknown: Yes    ALLERGIES:  is allergic to penicillins, codeine, and hydromorphone hcl.  MEDICATIONS:  Current Outpatient Medications  Medication Sig Dispense Refill   amLODipine (NORVASC) 10 MG tablet      Cholecalciferol 25 MCG (1000 UT) CHEW Chew by mouth.      gabapentin (NEURONTIN) 600 MG tablet Take by mouth.     omeprazole (PRILOSEC) 20 MG capsule Take by mouth.     raloxifene (EVISTA) 60 MG tablet      solifenacin (VESICARE) 5 MG tablet      tiotropium (SPIRIVA) 18 MCG inhalation capsule Place into inhaler and inhale.     No current facility-administered medications for this visit.     Marland Kitchen  PHYSICAL EXAMINATION:   Vitals:   04/03/22 1116  BP: (!) 146/89  Pulse: 87  Temp: (!) 97.1 F (36.2 C)   Filed Weights   04/03/22 1116  Weight: 156 lb (70.8 kg)    Physical Exam Vitals and nursing note reviewed.  HENT:     Head: Normocephalic and atraumatic.     Mouth/Throat:     Pharynx: Oropharynx is clear.  Eyes:     Extraocular Movements: Extraocular movements intact.     Pupils: Pupils are equal, round, and reactive to light.  Cardiovascular:     Rate and Rhythm: Normal rate and regular rhythm.  Pulmonary:     Comments: Decreased breath sounds bilaterally.  Abdominal:     Palpations: Abdomen is soft.  Musculoskeletal:        General: Normal range of motion.     Cervical back: Normal range of motion.  Skin:    General: Skin is warm.  Neurological:     General: No focal deficit present.     Mental Status: She is alert and oriented to person, place, and time.  Psychiatric:        Behavior: Behavior normal.        Judgment: Judgment normal.      LABORATORY DATA:  I have reviewed the data as listed Lab Results  Component Value Date   WBC 5.6 04/03/2022   HGB 11.5 (L) 04/03/2022   HCT 35.3 (L) 04/03/2022   MCV 84.0 04/03/2022   PLT 309 04/03/2022   No results for input(s): "NA", "K", "CL", "CO2", "GLUCOSE", "BUN", "CREATININE", "CALCIUM", "GFRNONAA", "GFRAA", "PROT", "ALBUMIN", "AST", "ALT", "ALKPHOS", "BILITOT", "BILIDIR", "IBILI" in the last 8760 hours.   No results found.  ASSESSMENT & PLAN:   Symptomatic anemia # Mild Anemia [Hb 11.5-12; since 2021] normal MCV- ? Symptomatic- mild fatigue.   # I had a long  discussion with the patient regarding etiology of unexplained anemia/thrombocytopenia.  The etiologies include -benign like  vs. Malignant causes.  Low clinical concern for any malignant causes.  I recommend CBC CMP LDH haptoglobin L87 folic acid reticulocyte count review of peripheral smear.  Iron studies; ferritin.myeloma panel kappa lambda light chain ratio.  Also discussed the usual work includes bone marrow biopsy. However, given mild now hold off bone marrow biopsy/imaging at this time.  #Etiology of iron deficiency: Unclear; GI evaluation-EGD colonoscopy; capsule study; CT scan abdomen pelvis.   # IV access; only Right PIV; hx of Left mastectomy + ALND [1990]- No evidence of any Lymphedema.   # Hx of Left breast cancer- No concerns for recurrence.   #  Hx of Right Lung cancer [2000]- stage I- No concerns of recurrence.   Thank you Dr.Sparks  for allowing me to participate in the care of your pleasant patient. Please do not hesitate to contact me with questions or concerns in the interim.  # DISPOSITION: # labs today- ordered # follow up in later in the first week of JAN- MD; No labs-Dr.B    All questions were answered. The patient knows to call the clinic with any problems, questions or concerns.    Cammie Sickle, MD 04/03/2022 7:19 PM

## 2022-04-03 NOTE — Assessment & Plan Note (Addendum)
#  Mild Anemia [Hb 11.5-12; since 2021] normal MCV- ? Symptomatic- mild fatigue.   # I had a long discussion with the patient regarding etiology of unexplained anemia/thrombocytopenia.  The etiologies include -benign like  vs. Malignant causes.  Low clinical concern for any malignant causes.  I recommend CBC CMP LDH haptoglobin G88 folic acid reticulocyte count review of peripheral smear.  Iron studies; ferritin.myeloma panel kappa lambda light chain ratio.  Also discussed the usual work includes bone marrow biopsy. However, given mild now hold off bone marrow biopsy/imaging at this time.  #Etiology of iron deficiency: Unclear; GI evaluation-EGD colonoscopy; capsule study; CT scan abdomen pelvis.   # IV access; only Right PIV; hx of Left mastectomy + ALND [1990]- No evidence of any Lymphedema.   # Hx of Left breast cancer- No concerns for recurrence.   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   Thank you Dr.Sparks  for allowing me to participate in the care of your pleasant patient. Please do not hesitate to contact me with questions or concerns in the interim.  # DISPOSITION: # labs today- ordered # follow up in later in the first week of JAN- MD; No labs-Dr.B

## 2022-04-04 LAB — KAPPA/LAMBDA LIGHT CHAINS
Kappa free light chain: 15 mg/L (ref 3.3–19.4)
Kappa, lambda light chain ratio: 2.27 — ABNORMAL HIGH (ref 0.26–1.65)
Lambda free light chains: 6.6 mg/L (ref 5.7–26.3)

## 2022-04-04 LAB — HAPTOGLOBIN: Haptoglobin: 204 mg/dL (ref 42–346)

## 2022-04-05 LAB — ERYTHROPOIETIN: Erythropoietin: 13.6 m[IU]/mL (ref 2.6–18.5)

## 2022-04-10 LAB — MULTIPLE MYELOMA PANEL, SERUM
Albumin SerPl Elph-Mcnc: 3.6 g/dL (ref 2.9–4.4)
Albumin/Glob SerPl: 1.2 (ref 0.7–1.7)
Alpha 1: 0.3 g/dL (ref 0.0–0.4)
Alpha2 Glob SerPl Elph-Mcnc: 0.8 g/dL (ref 0.4–1.0)
B-Globulin SerPl Elph-Mcnc: 1 g/dL (ref 0.7–1.3)
Gamma Glob SerPl Elph-Mcnc: 1 g/dL (ref 0.4–1.8)
Globulin, Total: 3.2 g/dL (ref 2.2–3.9)
IgA: 77 mg/dL (ref 64–422)
IgG (Immunoglobin G), Serum: 1006 mg/dL (ref 586–1602)
IgM (Immunoglobulin M), Srm: 52 mg/dL (ref 26–217)
Total Protein ELP: 6.8 g/dL (ref 6.0–8.5)

## 2022-04-27 ENCOUNTER — Inpatient Hospital Stay: Payer: Medicare Other | Attending: Internal Medicine | Admitting: Internal Medicine

## 2022-04-27 VITALS — BP 138/77 | HR 79 | Temp 97.9°F | Resp 18 | Wt 156.0 lb

## 2022-04-27 DIAGNOSIS — Z9012 Acquired absence of left breast and nipple: Secondary | ICD-10-CM | POA: Insufficient documentation

## 2022-04-27 DIAGNOSIS — Z9221 Personal history of antineoplastic chemotherapy: Secondary | ICD-10-CM | POA: Insufficient documentation

## 2022-04-27 DIAGNOSIS — Z85118 Personal history of other malignant neoplasm of bronchus and lung: Secondary | ICD-10-CM | POA: Diagnosis not present

## 2022-04-27 DIAGNOSIS — D696 Thrombocytopenia, unspecified: Secondary | ICD-10-CM | POA: Diagnosis present

## 2022-04-27 DIAGNOSIS — D649 Anemia, unspecified: Secondary | ICD-10-CM | POA: Diagnosis not present

## 2022-04-27 DIAGNOSIS — Z923 Personal history of irradiation: Secondary | ICD-10-CM | POA: Insufficient documentation

## 2022-04-27 DIAGNOSIS — Z853 Personal history of malignant neoplasm of breast: Secondary | ICD-10-CM | POA: Insufficient documentation

## 2022-04-27 DIAGNOSIS — Z87891 Personal history of nicotine dependence: Secondary | ICD-10-CM | POA: Insufficient documentation

## 2022-04-27 NOTE — Progress Notes (Signed)
[name]  Patient Care Team: Idelle Crouch, MD as PCP - General (Internal Medicine)  CHIEF COMPLAINTS/PURPOSE OF CONSULTATION: ANEMIA   HEMATOLOGY HISTORY  # 1990- left breast cancer- s/p lumpectomy; chemo- RT; no pills [ER- NEG]; [facility] Greek.   # Right Upper Lung cancer [2000] s/p surgery- [facility] Greek- No chemo- RT [Stage I]; remote smoking.  # ANEMIA[Hb; MCV-platelets- WBC; Iron sat; ferritin;  GFR- CT/US; EGD- none/colonoscopy-2023-Feb- NEG-     Latest Reference Range & Units 04/03/22 12:05  Kappa free light chain 3.3 - 19.4 mg/L 15.0  Lambda free light chains 5.7 - 26.3 mg/L 6.6  Kappa, lambda light chain ratio 0.26 - 1.65  2.27 (H)  (H): Data is abnormally high   Latest Reference Range & Units 04/03/22 12:05  LDH 98 - 192 U/L 118  Iron 28 - 170 ug/dL 59  UIBC ug/dL 323  TIBC 250 - 450 ug/dL 382  Saturation Ratios 10.4 - 31.8 % 15  Ferritin 11 - 307 ng/mL 23  Folate >5.9 ng/mL 13.9  Vitamin B12 180 - 914 pg/mL 594    HISTORY OF PRESENTING ILLNESS: Alone. Ambulating independently.   Monica Baxter 77 y.o.  female pleasant patient was been referred to [place] for further evaluation of anemia.  Patient continues to complain of mild fatigue.  Otherwise denies any blood in stools or black or stools.  No nausea no vomiting.   Review of Systems  Constitutional:  Positive for malaise/fatigue. Negative for chills, diaphoresis, fever and weight loss.  HENT:  Negative for nosebleeds and sore throat.   Eyes:  Negative for double vision.  Respiratory:  Negative for cough, hemoptysis, sputum production, shortness of breath and wheezing.   Cardiovascular:  Negative for chest pain, palpitations, orthopnea and leg swelling.  Gastrointestinal:  Negative for abdominal pain, blood in stool, constipation, diarrhea, heartburn, melena, nausea and vomiting.  Genitourinary:  Negative for dysuria, frequency and urgency.  Musculoskeletal:  Negative for back  pain and joint pain.  Skin: Negative.  Negative for itching and rash.  Neurological:  Negative for dizziness, tingling, focal weakness, weakness and headaches.  Endo/Heme/Allergies:  Does not bruise/bleed easily.  Psychiatric/Behavioral:  Negative for depression. The patient is not nervous/anxious and does not have insomnia.     MEDICAL HISTORY:  Past Medical History:  Diagnosis Date   Anemia    Breast cancer ([place]) 1990   left   Cancer ([place])    breast    Hypertension    Personal history of chemotherapy    Personal history of radiation therapy     SURGICAL HISTORY: Past Surgical History:  Procedure Laterality Date   APPENDECTOMY  2015   BREAST BIOPSY     BREAST EXCISIONAL BIOPSY     BREAST LUMPECTOMY Left [place]   COLONOSCOPY  2017   HERNIA REPAIR  2637   umbilical    LOBECTOMY Bilateral 2002,2015    SOCIAL HISTORY: Social History   Socioeconomic History   Marital status: Married    Spouse name: Not on file   Number of children: Not on file   Years of education: Not on file   Highest education level: Not on file  Occupational History   Not on file  Tobacco Use   Smoking status: Former    Types: Cigarettes    Quit date: 1982    Years since quitting: 42.0   Smokeless tobacco: Never  Substance and Sexual Activity   Alcohol use: Yes  Alcohol/week: 2.0 standard drinks of alcohol    Types: 2 Glasses of wine per week   Drug use: Never   Sexual activity: Not on file  Other Topics Concern   Not on file  Social History Narrative   Not on file   Social Determinants of Health   Financial Resource Strain: Not on file  Food Insecurity: Not on file  Transportation Needs: Not on file  Physical Activity: Not on file  Stress: Not on file  Social Connections: Not on file  Intimate Partner Violence: Not on file    FAMILY HISTORY: Family History  Adopted: Yes  Family history unknown: Yes    ALLERGIES:  is allergic to penicillins,  codeine, and hydromorphone hcl.  MEDICATIONS:  Current Outpatient Medications  Medication Sig Dispense Refill   amLODipine (NORVASC) 10 MG tablet      Cholecalciferol 25 MCG (1000 UT) CHEW Chew by mouth.     fluticasone-salmeterol (ADVAIR) 250-50 MCG/ACT AEPB SMARTSIG:By Mouth     gabapentin (NEURONTIN) 600 MG tablet Take by mouth.     omeprazole (PRILOSEC) 20 MG capsule Take by mouth.     raloxifene (EVISTA) 60 MG tablet      solifenacin (VESICARE) 5 MG tablet      tiotropium (SPIRIVA) 18 MCG inhalation capsule Place into inhaler and inhale.     No current facility-administered medications for this visit.     Marland Kitchen  PHYSICAL EXAMINATION:   Vitals:   04/27/22 1021  BP: 138/77  Pulse: 79  Resp: 18  Temp: 97.9 F (36.6 C)   Filed Weights   04/27/22 1021  Weight: 156 lb (70.8 kg)    Physical Exam Vitals and nursing note reviewed.  HENT:     Head: Normocephalic and atraumatic.     Mouth/Throat:     Pharynx: Oropharynx is clear.  Eyes:     Extraocular Movements: Extraocular movements intact.     Pupils: Pupils are equal, round, and reactive to light.  Cardiovascular:     Rate and Rhythm: Normal rate and regular rhythm.  Pulmonary:     Comments: Decreased breath sounds bilaterally.  Abdominal:     Palpations: Abdomen is soft.  Musculoskeletal:        General: Normal range of motion.     Cervical back: Normal range of motion.  Skin:    General: Skin is warm.  Neurological:     General: No focal deficit present.     Mental Status: She is alert and oriented to person, place, and time.  Psychiatric:        Behavior: Behavior normal.        Judgment: Judgment normal.      LABORATORY DATA:  I have reviewed the data as listed Lab Results  Component Value Date   WBC 5.6 04/03/2022   HGB 11.5 (L) 04/03/2022   HCT 35.3 (L) 04/03/2022   MCV 84.0 04/03/2022   PLT 309 04/03/2022   No results for input(s): "NA", "K", "CL", "CO2", "GLUCOSE", "BUN", "CREATININE",  "CALCIUM", "GFRNONAA", "GFRAA", "PROT", "ALBUMIN", "AST", "ALT", "ALKPHOS", "BILITOT", "BILIDIR", "IBILI" in the last 8760 hours.   No results found.  ASSESSMENT & PLAN:   Symptomatic anemia # Mild Anemia [Hb 11.5-12; since 2021] normal MCV- ? Symptomatic- mild fatigue; DEC 2023-iron saturation 15%: Ferritin 26.  Rest of the workup-B12/folic acid/M protein negative; kappa lambda light chain ratio slightly abnormal at 2.6.   # Discussed given the symptoms of ongoing fatigue; and mild-low normal iron deficiency would recommend  oral iron pills.  Relatively easy on the stomach.  Discussed that if oral iron does not improve her hemoglobin or symptoms-will consider further workup with a bone marrow biopsy.   # Etiology: Unclear; ?  Iron deficiency was others.  Patient never had EGD; No capsule; No CT Ab- okay to hold off at this time given low normal iron studies.  Again monitor for now.  # IV access; only Right PIV; hx of Left mastectomy + ALND [1990]- No evidence of any Lymphedema. STABLE.   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   # DISPOSITION: # follow up in 4 months-MD; labs- cbc/iron studies; ferritin---Dr.B    All questions were answered. The patient knows to call the clinic with any problems, questions or concerns.    Cammie Sickle, MD 04/27/2022 11:43 AM

## 2022-04-27 NOTE — Patient Instructions (Signed)
#    Gentle Iron (Iron Bisglycinate) 25 mg - one a day.

## 2022-04-27 NOTE — Progress Notes (Signed)
Patient here today for follow up regarding anemia, lab results. Patient denies any concerns today.

## 2022-04-27 NOTE — Assessment & Plan Note (Addendum)
#  Mild Anemia [Hb 11.5-12; since 2021] normal MCV- ? Symptomatic- mild fatigue; DEC 2023-iron saturation 15%: Ferritin 26.  Rest of the workup-B12/folic acid/M protein negative; kappa lambda light chain ratio slightly abnormal at 2.6.  Recommend rechecking again in 6 months.  # Discussed given the symptoms of ongoing fatigue; and mild-low normal iron deficiency would recommend oral iron pills.  Relatively easy on the stomach.  Discussed that if oral iron does not improve her hemoglobin or symptoms-will consider further workup with a bone marrow biopsy.   # Etiology: Unclear; ?  Iron deficiency was others.  Patient never had EGD; No capsule; No CT Ab- okay to hold off at this time given low normal iron studies.  Again monitor for now.  # IV access; only Right PIV; hx of Left mastectomy + ALND [1990]- No evidence of any Lymphedema. STABLE.   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   # DISPOSITION: # follow up in 4 months-MD; labs- cbc/iron studies; ferritin---Dr.B

## 2022-06-12 ENCOUNTER — Ambulatory Visit
Admission: RE | Admit: 2022-06-12 | Discharge: 2022-06-12 | Disposition: A | Discharge status: Home / Self Care | Payer: Medicare Other | Source: Ambulatory Visit | Attending: Internal Medicine | Admitting: Internal Medicine

## 2022-06-12 DIAGNOSIS — Z1231 Encounter for screening mammogram for malignant neoplasm of breast: Secondary | ICD-10-CM | POA: Insufficient documentation

## 2022-07-05 ENCOUNTER — Other Ambulatory Visit: Payer: Self-pay | Admitting: Otolaryngology

## 2022-07-05 ENCOUNTER — Encounter: Payer: Self-pay | Admitting: Otolaryngology

## 2022-07-05 DIAGNOSIS — R490 Dysphonia: Secondary | ICD-10-CM

## 2022-07-13 ENCOUNTER — Ambulatory Visit
Admission: RE | Admit: 2022-07-13 | Discharge: 2022-07-13 | Disposition: A | Discharge status: Home / Self Care | Payer: Medicare Other | Source: Ambulatory Visit | Attending: Otolaryngology | Admitting: Otolaryngology

## 2022-07-13 DIAGNOSIS — R490 Dysphonia: Secondary | ICD-10-CM

## 2022-07-13 MED ORDER — IOPAMIDOL (ISOVUE-300) INJECTION 61%
50.0000 mL | Freq: Once | INTRAVENOUS | Status: AC | PRN
  Administered 2022-07-13: 50 mL via INTRAVENOUS

## 2022-07-28 ENCOUNTER — Inpatient Hospital Stay: Payer: Medicare Other | Attending: Internal Medicine | Admitting: Nurse Practitioner

## 2022-07-28 ENCOUNTER — Encounter: Payer: Self-pay | Admitting: Nurse Practitioner

## 2022-07-28 VITALS — BP 116/64 | HR 83 | Temp 97.9°F | Wt 151.0 lb

## 2022-07-28 DIAGNOSIS — Z87891 Personal history of nicotine dependence: Secondary | ICD-10-CM | POA: Diagnosis not present

## 2022-07-28 DIAGNOSIS — Z923 Personal history of irradiation: Secondary | ICD-10-CM | POA: Diagnosis not present

## 2022-07-28 DIAGNOSIS — R918 Other nonspecific abnormal finding of lung field: Secondary | ICD-10-CM | POA: Diagnosis not present

## 2022-07-28 DIAGNOSIS — Z9221 Personal history of antineoplastic chemotherapy: Secondary | ICD-10-CM | POA: Diagnosis not present

## 2022-07-28 DIAGNOSIS — Z85118 Personal history of other malignant neoplasm of bronchus and lung: Secondary | ICD-10-CM | POA: Diagnosis not present

## 2022-07-28 DIAGNOSIS — Z853 Personal history of malignant neoplasm of breast: Secondary | ICD-10-CM

## 2022-07-28 DIAGNOSIS — J449 Chronic obstructive pulmonary disease, unspecified: Secondary | ICD-10-CM | POA: Diagnosis not present

## 2022-07-28 DIAGNOSIS — D649 Anemia, unspecified: Secondary | ICD-10-CM | POA: Diagnosis present

## 2022-07-28 DIAGNOSIS — D696 Thrombocytopenia, unspecified: Secondary | ICD-10-CM | POA: Insufficient documentation

## 2022-07-28 NOTE — Progress Notes (Signed)
[facility] CONSULT NOTE  Patient Care Team: Marguarite Arbour, MD as PCP - General (Internal Medicine)  CHIEF COMPLAINTS/PURPOSE OF CONSULTATION: ANEMIA   HEMATOLOGY HISTORY  # 1990- left breast cancer- s/p lumpectomy; chemo- RT; no pills [ER- NEG]; Ruffin Frederick.   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  # Left Lung Cancer (2015)- s/p wedge. No chemo or RT. Stage 1  # Former smoker (quit 30+ years ago). Hx of COPD.   # ANEMIA [Hb; MCV-platelets- WBC; Iron sat; ferritin;  GFR- CT/US; EGD- none/colonoscopy- 2023-Feb- NEG-    Latest Reference Range & Units 04/03/22 12:05  Kappa free light chain 3.3 - 19.4 mg/L 15.0  Lambda free light chains 5.7 - 26.3 mg/L 6.6  Kappa, lambda light chain ratio 0.26 - 1.65  2.27 (H)  (H): Data is abnormally high   Latest Reference Range & Units 04/03/22 12:05  LDH 98 - 192 U/L 118  Iron 28 - 170 ug/dL 59  UIBC ug/dL 469  TIBC 629 - 528 ug/dL 413  Saturation Ratios 10.4 - 31.8 % 15  Ferritin 11 - 307 ng/mL 23  Folate >5.9 ng/mL 13.9  Vitamin B12 180 - 914 pg/mL 594    HISTORY OF PRESENTING ILLNESS: Alone. Ambulating independently.   Monica Baxter 77 y.o.  female pleasant patient was been referred to [place] for further evaluation of enlarging lung nodules. She saw her PCP for hoarseness. Imaging of neck and chest was obtained which showed enlarging lung nodules. She presents today for discussion of results and management. She quit smoking 30+ years ago. Has had 2 primary lung malignancies in the past and undergone surgical resection. Hx of breast cancer. She has COPD which is currently controlled. Exercises; came from aerobics today. Denies unintentional weight loss, cough, hemoptysis, fevers, or chills. She feels well otherwise.    Review of Systems  Constitutional:  Negative for chills, diaphoresis, fever, malaise/fatigue and weight loss.  HENT:  Negative for nosebleeds  and sore throat.   Eyes:  Negative for double vision.  Respiratory:  Negative for cough, hemoptysis, sputum production, shortness of breath and wheezing.   Cardiovascular:  Negative for chest pain, palpitations, orthopnea and leg swelling.  Gastrointestinal:  Negative for abdominal pain, blood in stool, constipation, diarrhea, heartburn, melena, nausea and vomiting.  Genitourinary:  Negative for dysuria, frequency and urgency.  Musculoskeletal:  Negative for back pain and joint pain.  Skin:  Negative for itching and rash.  Neurological:  Negative for dizziness, tingling, focal weakness, weakness and headaches.  Endo/Heme/Allergies:  Does not bruise/bleed easily.  Psychiatric/Behavioral:  Negative for depression. The patient is not nervous/anxious and does not have insomnia.     MEDICAL HISTORY:  Past Medical History:  Diagnosis Date   Anemia    Breast cancer 1990   left   Cancer    breast    Hypertension    Personal history of chemotherapy    Personal history of radiation therapy     SURGICAL HISTORY: Past Surgical History:  Procedure Laterality Date   APPENDECTOMY  2015   BREAST BIOPSY     BREAST EXCISIONAL BIOPSY     BREAST LUMPECTOMY Left 1990   CHOLECYSTECTOMY  1995   COLONOSCOPY  2017   HERNIA REPAIR  2016   umbilical    LOBECTOMY Bilateral 2002,2015    SOCIAL HISTORY: Social History   Socioeconomic History   Marital status: Married    Spouse name:  Not on file   Number of children: Not on file   Years of education: Not on file   Highest education level: Not on file  Occupational History   Not on file  Tobacco Use   Smoking status: Former    Types: Cigarettes    Quit date: 591982    Years since quitting: 42.2   Smokeless tobacco: Never  Substance and Sexual Activity   Alcohol use: Yes    Alcohol/week: 2.0 standard drinks of alcohol    Types: 2 Glasses of wine per week   Drug use: Never   Sexual activity: Not on file  Other Topics Concern   Not on file   Social History Narrative   Not on file   Social Determinants of Health   Financial Resource Strain: Not on file  Food Insecurity: Not on file  Transportation Needs: Not on file  Physical Activity: Not on file  Stress: Not on file  Social Connections: Not on file  Intimate Partner Violence: Not on file    FAMILY HISTORY: Family History  Adopted: Yes  Family history unknown: Yes    ALLERGIES:  is allergic to penicillins, codeine, and hydromorphone hcl.  MEDICATIONS:  Current Outpatient Medications  Medication Sig Dispense Refill   amLODipine (NORVASC) 10 MG tablet      Cholecalciferol 25 MCG (1000 UT) CHEW Chew by mouth.     fluticasone-salmeterol (ADVAIR) 250-50 MCG/ACT AEPB SMARTSIG:By Mouth     gabapentin (NEURONTIN) 600 MG tablet Take by mouth.     omeprazole (PRILOSEC) 20 MG capsule Take by mouth.     raloxifene (EVISTA) 60 MG tablet      solifenacin (VESICARE) 5 MG tablet      tiotropium (SPIRIVA) 18 MCG inhalation capsule Place into inhaler and inhale.     No current facility-administered medications for this visit.    PHYSICAL EXAMINATION: Vitals:   07/28/22 1030  BP: 116/64  Pulse: 83  Temp: 97.9 F (36.6 C)  SpO2: 99%   Filed Weights   07/28/22 1030  Weight: 151 lb (68.5 kg)   Physical Exam Constitutional:      Appearance: She is not ill-appearing.  Cardiovascular:     Rate and Rhythm: Normal rate and regular rhythm.  Pulmonary:     Effort: Pulmonary effort is normal. No respiratory distress.  Abdominal:     General: There is no distension.     Tenderness: There is no abdominal tenderness.  Musculoskeletal:     Comments: Ambulates w/o aids  Skin:    General: Skin is warm and dry.  Neurological:     Mental Status: She is alert and oriented to person, place, and time.  Psychiatric:        Mood and Affect: Mood normal.        Behavior: Behavior normal.     LABORATORY DATA:  I have reviewed the data as listed Lab Results  Component Value  Date   WBC 5.6 04/03/2022   HGB 11.5 (L) 04/03/2022   HCT 35.3 (L) 04/03/2022   MCV 84.0 04/03/2022   PLT 309 04/03/2022     CT CHEST W CONTRAST  Result Date: 07/17/2022 CLINICAL DATA:  Coarseness 1 year. History of left breast cancer post lumpectomy. Also history of right lung cancer post upper lobectomy and left lung cancer treated with wedge resection in 2015. EXAM: CT CHEST WITH CONTRAST TECHNIQUE: Multidetector CT imaging of the chest was performed during intravenous contrast administration. RADIATION DOSE REDUCTION: This exam was performed  according to the departmental dose-optimization program which includes automated exposure control, adjustment of the mA and/or kV according to patient size and/or use of iterative reconstruction technique. CONTRAST:  50mL ISOVUE-300 IOPAMIDOL (ISOVUE-300) INJECTION 61% COMPARISON:  12/27/2018 FINDINGS: Cardiovascular: Heart is normal size. There is calcified plaque over the left anterior descending and right coronary arteries. Thoracic aorta is normal in caliber. Mild calcified plaque over the descending thoracic aorta. Pulmonary arterial system is unremarkable. Mediastinum/Nodes: No mediastinal or hilar adenopathy. Small sliding hiatal hernia is present. Lungs/Pleura: Lungs are adequately inflated. Evidence of previous right upper lobectomy and left wedge resection. There are multiple subcentimeter bilateral pulmonary nodules most of which are unchanged. The largest nodule is located over the left lower lobe (image 70) which demonstrate interval increase in size new 3 mm nodule over the right upper lobe (image 36). Two peripheral new areas of reticulonodular density over the right middle lobe. No effusion. Remainder of the lungs are unchanged. Upper Abdomen: Calcified plaque over the abdominal aorta. Previous cholecystectomy. Remainder of the upper abdomen is unchanged. No acute findings. Musculoskeletal: Mild degenerative change of the spine. Postsurgical change  over the left breast. IMPRESSION: 1. Evidence of previous right upper lobectomy and left wedge resection. Multiple subcentimeter bilateral pulmonary nodules most of which are unchanged. The largest nodule is located over the left lower lobe which demonstrate interval increase in size. New 3 mm nodule over the right upper lobe. These findings are concerning for metastatic disease. Recommend attention on follow-up. 2. Two new peripheral areas of reticulonodular density over the right middle lobe. Findings may be due to an infectious or inflammatory process, although metastatic disease is possible. 3. Aortic atherosclerosis. Atherosclerotic coronary artery disease. 4. Small sliding hiatal hernia. Aortic Atherosclerosis (ICD10-I70.0). Electronically Signed   By: Elberta Fortis M.D.   On: 07/17/2022 08:27   CT SOFT TISSUE NECK W CONTRAST  Result Date: 07/17/2022 CLINICAL DATA:  Hoarseness over the last year. History of left breast cancer with lumpectomy. Radiation and chemotherapy for treatment of left-sided lung cancer. EXAM: CT NECK WITH CONTRAST TECHNIQUE: Multidetector CT imaging of the neck was performed using the standard protocol following the bolus administration of intravenous contrast. RADIATION DOSE REDUCTION: This exam was performed according to the departmental dose-optimization program which includes automated exposure control, adjustment of the mA and/or kV according to patient size and/or use of iterative reconstruction technique. CONTRAST:  50mL ISOVUE-300 IOPAMIDOL (ISOVUE-300) INJECTION 61% COMPARISON:  None Available. FINDINGS: Pharynx and larynx: No mucosal or submucosal lesion. The larynx appears symmetric. There is presumed voluntary closure of the glottis. Salivary glands: Parotid and submandibular glands are normal. Thyroid: Normal Lymph nodes: No lymphadenopathy seen on either side of the neck. No abnormality seen to explain any recurrent laryngeal nerve dysfunction that may be present  clinically. Vascular: No abnormal vascular finding. Limited intracranial: Normal Visualized orbits: Normal, minimally visualized. Mastoids and visualized paranasal sinuses: Clear Skeleton: Ordinary spondylosis and facet arthritis. Upper chest: See results of chest CT. Other: None IMPRESSION: 1. No abnormality seen to explain the clinical presentation. No evidence of mass or lymphadenopathy. No abnormality seen to explain recurrent laryngeal nerve dysfunction. 2. See results of chest CT. Electronically Signed   By: Paulina Fusi M.D.   On: 07/17/2022 07:54    ASSESSMENT & PLAN:   No problem-specific Assessment & Plan notes found for this encounter.  Multiple Lung Nodules - CT neck and chest performed for hoarseness (thought to be related to vocal cord changes. SLP. Incidental lung nodules found  to be enlarging. LLL enlarged by 3 mm. I personally reviewed imaging from 07/17/22 and 12/28/2018. I agree with enlarging lung nodules. Small, < 1 cm. I discussed findings with Dr. Donneta Romberg who recommends discussion of imaging at case conference. Reviewed with patient that would consider short term follow up with repeat imaging which she is agreeable to. She says she would be hesitant to undergo additional surgery if that was offered but would consider other treatments. Not eager to undergo biopsy currently. She will follow up with Dr Donneta Romberg as scheduled for discussion of plan following case conference.  History of Lung Cancer - 2000 and 2015. Per patient, 2 separate primaries. S/p RU lobectomy and LUL wedge. Stage I. No adjuvant treatments.  History of Breast Cancer- hx of left lumpectomy w/ axillary LNDx (1990). No hx of lymphedema. Mammogram 06/13/22 was negative for malignancy.  Symptomatic Anemia- Mild Anemia [Hb 11.5-12; since 2021] normal MCV- ? Symptomatic- mild fatigue; DEC 2023-iron saturation 15%: Ferritin 26.  Rest of the workup-B12/folic acid/M protein negative; kappa lambda light chain ratio slightly  abnormal at 2.6. Etiology: Unclear; Iron def vs others. Never had egd or capsule. No recent CT abd; ok to hold off for now given low normal iron studies. Follow up with Dr Donneta Romberg as scheduled Continue oral iron pills. If symptoms or hmg don't improve would consider addtl w/u.   Former smoker & COPD- copd managed by Dr Meredeth Ide. Not on oxygen. Quit smoking 30 + years ago. Good functional status.  IV access- only right PIV d/t left ALND.   Disposition:  Add on to case conference  Follow up with Dr. Donneta Romberg as scheduled.   All questions were answered. The patient knows to call the clinic with any problems, questions or concerns.  Alinda Dooms, NP 07/28/2022   CC: Dr Donneta Romberg

## 2022-08-03 ENCOUNTER — Telehealth: Payer: Self-pay | Admitting: Internal Medicine

## 2022-08-03 ENCOUNTER — Other Ambulatory Visit: Payer: Medicare Other

## 2022-08-03 NOTE — Telephone Encounter (Signed)
Imaging reviewed at the tumor conference-metastatic disease versus primary versus DIPNEC.   Lauren-please inform the patient I would recommend a PET scan for further evaluation.  If patient is interested; please order a PET scan; and have the patient follow-up in 1 to 2 days after the PET scan.  Thanks GB

## 2022-08-04 ENCOUNTER — Other Ambulatory Visit: Payer: Self-pay | Admitting: Nurse Practitioner

## 2022-08-04 DIAGNOSIS — R918 Other nonspecific abnormal finding of lung field: Secondary | ICD-10-CM

## 2022-08-04 NOTE — Progress Notes (Signed)
Spoke to patient by phone. Reviewed case conference recommendation for pet scan. Patient agrees to proceed. Imaging ordered and will send message for scheduling. She'll follow up with Dr. Donneta Romberg for results.

## 2022-08-08 ENCOUNTER — Ambulatory Visit
Admission: RE | Admit: 2022-08-08 | Discharge: 2022-08-08 | Disposition: A | Discharge status: Home / Self Care | Payer: Medicare Other | Source: Ambulatory Visit | Attending: Nurse Practitioner | Admitting: Nurse Practitioner

## 2022-08-08 DIAGNOSIS — Z853 Personal history of malignant neoplasm of breast: Secondary | ICD-10-CM | POA: Insufficient documentation

## 2022-08-08 DIAGNOSIS — K449 Diaphragmatic hernia without obstruction or gangrene: Secondary | ICD-10-CM | POA: Insufficient documentation

## 2022-08-08 DIAGNOSIS — R911 Solitary pulmonary nodule: Secondary | ICD-10-CM | POA: Insufficient documentation

## 2022-08-08 DIAGNOSIS — Z902 Acquired absence of lung [part of]: Secondary | ICD-10-CM | POA: Diagnosis not present

## 2022-08-08 DIAGNOSIS — K5730 Diverticulosis of large intestine without perforation or abscess without bleeding: Secondary | ICD-10-CM | POA: Insufficient documentation

## 2022-08-08 DIAGNOSIS — R918 Other nonspecific abnormal finding of lung field: Secondary | ICD-10-CM

## 2022-08-08 DIAGNOSIS — I700 Atherosclerosis of aorta: Secondary | ICD-10-CM | POA: Diagnosis not present

## 2022-08-08 LAB — GLUCOSE, CAPILLARY: Glucose-Capillary: 101 mg/dL — ABNORMAL HIGH (ref 70–99)

## 2022-08-08 MED ORDER — FLUDEOXYGLUCOSE F - 18 (FDG) INJECTION
8.1700 | Freq: Once | INTRAVENOUS | Status: AC | PRN
  Administered 2022-08-08: 8.17 via INTRAVENOUS

## 2022-08-11 ENCOUNTER — Encounter: Payer: Self-pay | Admitting: Internal Medicine

## 2022-08-11 ENCOUNTER — Inpatient Hospital Stay: Payer: Medicare Other

## 2022-08-11 ENCOUNTER — Inpatient Hospital Stay (HOSPITAL_BASED_OUTPATIENT_CLINIC_OR_DEPARTMENT_OTHER): Payer: Medicare Other | Admitting: Internal Medicine

## 2022-08-11 VITALS — BP 128/73 | HR 79 | Temp 98.6°F | Resp 18 | Wt 153.8 lb

## 2022-08-11 DIAGNOSIS — D649 Anemia, unspecified: Secondary | ICD-10-CM

## 2022-08-11 LAB — FERRITIN: Ferritin: 42 ng/mL (ref 11–307)

## 2022-08-11 LAB — IRON AND TIBC
Iron: 123 ug/dL (ref 28–170)
Saturation Ratios: 28 % (ref 10.4–31.8)
TIBC: 440 ug/dL (ref 250–450)
UIBC: 317 ug/dL

## 2022-08-11 LAB — CBC WITH DIFFERENTIAL/PLATELET
Abs Immature Granulocytes: 0.02 10*3/uL (ref 0.00–0.07)
Basophils Absolute: 0 10*3/uL (ref 0.0–0.1)
Basophils Relative: 1 %
Eosinophils Absolute: 0.1 10*3/uL (ref 0.0–0.5)
Eosinophils Relative: 2 %
HCT: 33 % — ABNORMAL LOW (ref 36.0–46.0)
Hemoglobin: 10.8 g/dL — ABNORMAL LOW (ref 12.0–15.0)
Immature Granulocytes: 0 %
Lymphocytes Relative: 36 %
Lymphs Abs: 2.2 10*3/uL (ref 0.7–4.0)
MCH: 28.2 pg (ref 26.0–34.0)
MCHC: 32.7 g/dL (ref 30.0–36.0)
MCV: 86.2 fL (ref 80.0–100.0)
Monocytes Absolute: 0.5 10*3/uL (ref 0.1–1.0)
Monocytes Relative: 8 %
Neutro Abs: 3.3 10*3/uL (ref 1.7–7.7)
Neutrophils Relative %: 53 %
Platelets: 337 10*3/uL (ref 150–400)
RBC: 3.83 MIL/uL — ABNORMAL LOW (ref 3.87–5.11)
RDW: 13.5 % (ref 11.5–15.5)
WBC: 6.1 10*3/uL (ref 4.0–10.5)
nRBC: 0 % (ref 0.0–0.2)

## 2022-08-11 NOTE — Progress Notes (Signed)
[facility] CONSULT NOTE  Patient Care Team: Marguarite Arbour, MD as PCP - General (Internal Medicine)  CHIEF COMPLAINTS/PURPOSE OF CONSULTATION: ANEMIA  HEMATOLOGY HISTORY  # 1990- left breast cancer- s/p lumpectomy; chemo- RT; no pills [ER- NEG]; [facility].   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  # ANEMIA[Hb; MCV-platelets- WBC; Iron sat; ferritin;  GFR- CT/US; EGD- none/colonoscopy-2023-Feb- NEG-     Latest Reference Range & Units 04/03/22 12:05  Kappa free light chain 3.3 - 19.4 mg/L 15.0  Lambda free light chains 5.7 - 26.3 mg/L 6.6  Kappa, lambda light chain ratio 0.26 - 1.65  2.27 (H)  (H): Data is abnormally high   Latest Reference Range & Units 04/03/22 12:05  LDH 98 - 192 U/L 118  Iron 28 - 170 ug/dL 59  UIBC ug/dL 130  TIBC 865 - 784 ug/dL 696  Saturation Ratios 10.4 - 31.8 % 15  Ferritin 11 - 307 ng/mL 23  Folate >5.9 ng/mL 13.9  Vitamin B12 180 - 914 pg/mL 594    HISTORY OF PRESENTING ILLNESS: with husband, Nadine Counts. Ambulating independently.   Monica Baxter 77 y.o.  female pleasant patient with mild anemia noted to have incidental lung nodules is here to follow-up results of the PET scan.  Patient was recently evaluated by ENT for hoarseness of voice.  Imaging showed bilateral lung nodules in the chest.  She had a follow-up PET scan  Patient denies any worsening fatigue.  Otherwise denies any blood in stools or black or stools.  No nausea no vomiting.   Review of Systems  Constitutional:  Positive for malaise/fatigue. Negative for chills, diaphoresis, fever and weight loss.  HENT:  Negative for nosebleeds and sore throat.   Eyes:  Negative for double vision.  Respiratory:  Positive for cough. Negative for hemoptysis, sputum production, shortness of breath and wheezing.   Cardiovascular:  Negative for chest pain, palpitations, orthopnea and leg swelling.  Gastrointestinal:   Negative for abdominal pain, blood in stool, constipation, diarrhea, heartburn, melena, nausea and vomiting.  Genitourinary:  Negative for dysuria, frequency and urgency.  Musculoskeletal:  Negative for back pain and joint pain.  Skin: Negative.  Negative for itching and rash.  Neurological:  Negative for dizziness, tingling, focal weakness, weakness and headaches.  Endo/Heme/Allergies:  Does not bruise/bleed easily.  Psychiatric/Behavioral:  Negative for depression. The patient is not nervous/anxious and does not have insomnia.     MEDICAL HISTORY:  Past Medical History:  Diagnosis Date   Anemia    Breast cancer 1990   left   Cancer    breast    Hypertension    Personal history of chemotherapy    Personal history of radiation therapy     SURGICAL HISTORY: Past Surgical History:  Procedure Laterality Date   APPENDECTOMY  2015   BREAST BIOPSY     BREAST EXCISIONAL BIOPSY     BREAST LUMPECTOMY Left 1990   CHOLECYSTECTOMY  1995   COLONOSCOPY  2017   HERNIA REPAIR  2016   umbilical    LOBECTOMY Bilateral 2002,2015    SOCIAL HISTORY: Social History   Socioeconomic History   Marital status: Married    Spouse name: Not on file   Number of children: Not on file   Years of education: Not on file   Highest education level: Not on file  Occupational History   Not on file  Tobacco Use   Smoking status:  Former    Types: Cigarettes    Quit date: 1982    Years since quitting: 42.3   Smokeless tobacco: Never  Substance and Sexual Activity   Alcohol use: Yes    Alcohol/week: 2.0 standard drinks of alcohol    Types: 2 Glasses of wine per week   Drug use: Never   Sexual activity: Not on file  Other Topics Concern   Not on file  Social History Narrative   Not on file   Social Determinants of Health   Financial Resource Strain: Not on file  Food Insecurity: Not on file  Transportation Needs: Not on file  Physical Activity: Not on file  Stress: Not on file  Social  Connections: Not on file  Intimate Partner Violence: Not on file    FAMILY HISTORY: Family History  Adopted: Yes  Family history unknown: Yes    ALLERGIES:  is allergic to penicillins, codeine, and hydromorphone hcl.  MEDICATIONS:  Current Outpatient Medications  Medication Sig Dispense Refill   amLODipine (NORVASC) 10 MG tablet      Cholecalciferol (VITAMIN D3) 1000 units CAPS Take 1 tablet by mouth daily.     cyanocobalamin (VITAMIN B12) 1000 MCG tablet Take 1,000 mcg by mouth daily.     fluticasone-salmeterol (ADVAIR) 250-50 MCG/ACT AEPB SMARTSIG:By Mouth     gabapentin (NEURONTIN) 600 MG tablet Take by mouth.     omeprazole (PRILOSEC) 20 MG capsule Take by mouth.     raloxifene (EVISTA) 60 MG tablet      solifenacin (VESICARE) 5 MG tablet      tiotropium (SPIRIVA) 18 MCG inhalation capsule Place into inhaler and inhale.     No current facility-administered medications for this visit.     Marland Kitchen  PHYSICAL EXAMINATION:   Vitals:   08/11/22 1523  BP: 128/73  Pulse: 79  Resp: 18  Temp: 98.6 F (37 C)  SpO2: 99%    Filed Weights   08/11/22 1523  Weight: 153 lb 12.8 oz (69.8 kg)     Physical Exam Vitals and nursing note reviewed.  HENT:     Head: Normocephalic and atraumatic.     Mouth/Throat:     Pharynx: Oropharynx is clear.  Eyes:     Extraocular Movements: Extraocular movements intact.     Pupils: Pupils are equal, round, and reactive to light.  Cardiovascular:     Rate and Rhythm: Normal rate and regular rhythm.  Pulmonary:     Comments: Decreased breath sounds bilaterally.  Abdominal:     Palpations: Abdomen is soft.  Musculoskeletal:        General: Normal range of motion.     Cervical back: Normal range of motion.  Skin:    General: Skin is warm.  Neurological:     General: No focal deficit present.     Mental Status: She is alert and oriented to person, place, and time.  Psychiatric:        Behavior: Behavior normal.        Judgment:  Judgment normal.      LABORATORY DATA:  I have reviewed the data as listed Lab Results  Component Value Date   WBC 6.1 08/11/2022   HGB 10.8 (L) 08/11/2022   HCT 33.0 (L) 08/11/2022   MCV 86.2 08/11/2022   PLT 337 08/11/2022   No results for input(s): "NA", "K", "CL", "CO2", "GLUCOSE", "BUN", "CREATININE", "CALCIUM", "GFRNONAA", "GFRAA", "PROT", "ALBUMIN", "AST", "ALT", "ALKPHOS", "BILITOT", "BILIDIR", "IBILI" in the last 8760 hours.   NM  PET Image Initial (PI) Skull Base To Thigh  Result Date: 08/10/2022 CLINICAL DATA:  Initial treatment strategy for enlarging pulmonary nodule. History of left breast cancer status post lumpectomy in 1990. History of right upper lobectomy for lung cancer in 2000 and wedge resection for left lung cancer in 2015. EXAM: NUCLEAR MEDICINE PET SKULL BASE TO THIGH TECHNIQUE: 8.2 mCi F-18 FDG was injected intravenously. Full-ring PET imaging was performed from the skull base to thigh after the radiotracer. CT data was obtained and used for attenuation correction and anatomic localization. Fasting blood glucose: 101 mg/dl COMPARISON:  96/07/5407 CT neck and chest. FINDINGS: Mediastinal blood pool activity: SUV max 2.0 Liver activity: SUV max NA NECK: No hypermetabolic lymph nodes in the neck. Incidental CT findings: None. CHEST: No enlarged or hypermetabolic axillary, mediastinal or hilar lymph nodes. No hypermetabolic pulmonary findings. Medial left upper lobe 0.4 cm solid pulmonary nodule (series 4/image 56), medial lingula 0.4 cm solid pulmonary nodule (series 4/image 66), right lower lobe 0.5 cm solid pulmonary nodule (series 4/image 57) and right middle lobe 0.2 cm solid pulmonary nodule (series 4/image 51) are all stable from 12/27/2018 chest CT and considered benign. Central left lower lobe 0.7 cm solid pulmonary nodule (series 4/image 55) is below PET resolution and demonstrates no significant FDG uptake with max SUV 0.8, unchanged from 07/13/2022 chest CT,  increased from 0.3 cm on 12/27/2018 chest CT. Superior right middle lobe 0.3 cm solid pulmonary nodule (series 4/image 44) is below PET resolution and demonstrates no significant FDG uptake, stable from 07/13/2022 chest CT and new from 12/27/2018 chest CT. Incidental CT findings: Right upper lobectomy and medial left upper lobe wedge resection. Coronary atherosclerosis. Atherosclerotic nonaneurysmal thoracic aorta. ABDOMEN/PELVIS: No abnormal hypermetabolic activity within the liver, pancreas, adrenal glands, or spleen. No hypermetabolic lymph nodes in the abdomen or pelvis. Incidental CT findings: Small hiatal hernia. Atherosclerotic nonaneurysmal abdominal aorta. Cholecystectomy. Marked sigmoid diverticulosis. SKELETON: No focal hypermetabolic activity to suggest skeletal metastasis. Incidental CT findings: None. IMPRESSION: 1. No hypermetabolic metastatic disease. 2. Central left lower lobe 0.7 cm solid pulmonary nodule is below PET resolution and demonstrates no significant FDG uptake, unchanged from 07/13/2022 chest CT, increased from 0.3 cm on 12/27/2018 chest CT. Superior right middle lobe 0.3 cm solid pulmonary nodule is below PET resolution, stable from 07/13/2022 chest CT and new from 12/27/2018 chest CT. These nodules warrant attention on close follow-up chest CT in 3-6 months given the change from prior imaging. 3. Small hiatal hernia. 4. Marked sigmoid diverticulosis. 5.  Aortic Atherosclerosis (ICD10-I70.0). Electronically Signed   By: Delbert Phenix M.D.   On: 08/10/2022 16:06   CT CHEST W CONTRAST  Result Date: 07/17/2022 CLINICAL DATA:  Coarseness 1 year. History of left breast cancer post lumpectomy. Also history of right lung cancer post upper lobectomy and left lung cancer treated with wedge resection in 2015. EXAM: CT CHEST WITH CONTRAST TECHNIQUE: Multidetector CT imaging of the chest was performed during intravenous contrast administration. RADIATION DOSE REDUCTION: This exam was performed  according to the departmental dose-optimization program which includes automated exposure control, adjustment of the mA and/or kV according to patient size and/or use of iterative reconstruction technique. CONTRAST:  50mL ISOVUE-300 IOPAMIDOL (ISOVUE-300) INJECTION 61% COMPARISON:  12/27/2018 FINDINGS: Cardiovascular: Heart is normal size. There is calcified plaque over the left anterior descending and right coronary arteries. Thoracic aorta is normal in caliber. Mild calcified plaque over the descending thoracic aorta. Pulmonary arterial system is unremarkable. Mediastinum/Nodes: No mediastinal or hilar adenopathy. Small  sliding hiatal hernia is present. Lungs/Pleura: Lungs are adequately inflated. Evidence of previous right upper lobectomy and left wedge resection. There are multiple subcentimeter bilateral pulmonary nodules most of which are unchanged. The largest nodule is located over the left lower lobe (image 70) which demonstrate interval increase in size new 3 mm nodule over the right upper lobe (image 36). Two peripheral new areas of reticulonodular density over the right middle lobe. No effusion. Remainder of the lungs are unchanged. Upper Abdomen: Calcified plaque over the abdominal aorta. Previous cholecystectomy. Remainder of the upper abdomen is unchanged. No acute findings. Musculoskeletal: Mild degenerative change of the spine. Postsurgical change over the left breast. IMPRESSION: 1. Evidence of previous right upper lobectomy and left wedge resection. Multiple subcentimeter bilateral pulmonary nodules most of which are unchanged. The largest nodule is located over the left lower lobe which demonstrate interval increase in size. New 3 mm nodule over the right upper lobe. These findings are concerning for metastatic disease. Recommend attention on follow-up. 2. Two new peripheral areas of reticulonodular density over the right middle lobe. Findings may be due to an infectious or inflammatory process,  although metastatic disease is possible. 3. Aortic atherosclerosis. Atherosclerotic coronary artery disease. 4. Small sliding hiatal hernia. Aortic Atherosclerosis (ICD10-I70.0). Electronically Signed   By: Elberta Fortis M.D.   On: 07/17/2022 08:27   CT SOFT TISSUE NECK W CONTRAST  Result Date: 07/17/2022 CLINICAL DATA:  Hoarseness over the last year. History of left breast cancer with lumpectomy. Radiation and chemotherapy for treatment of left-sided lung cancer. EXAM: CT NECK WITH CONTRAST TECHNIQUE: Multidetector CT imaging of the neck was performed using the standard protocol following the bolus administration of intravenous contrast. RADIATION DOSE REDUCTION: This exam was performed according to the departmental dose-optimization program which includes automated exposure control, adjustment of the mA and/or kV according to patient size and/or use of iterative reconstruction technique. CONTRAST:  50mL ISOVUE-300 IOPAMIDOL (ISOVUE-300) INJECTION 61% COMPARISON:  None Available. FINDINGS: Pharynx and larynx: No mucosal or submucosal lesion. The larynx appears symmetric. There is presumed voluntary closure of the glottis. Salivary glands: Parotid and submandibular glands are normal. Thyroid: Normal Lymph nodes: No lymphadenopathy seen on either side of the neck. No abnormality seen to explain any recurrent laryngeal nerve dysfunction that may be present clinically. Vascular: No abnormal vascular finding. Limited intracranial: Normal Visualized orbits: Normal, minimally visualized. Mastoids and visualized paranasal sinuses: Clear Skeleton: Ordinary spondylosis and facet arthritis. Upper chest: See results of chest CT. Other: None IMPRESSION: 1. No abnormality seen to explain the clinical presentation. No evidence of mass or lymphadenopathy. No abnormality seen to explain recurrent laryngeal nerve dysfunction. 2. See results of chest CT. Electronically Signed   By: Paulina Fusi M.D.   On: 07/17/2022 07:54     ASSESSMENT & PLAN:   Symptomatic anemia # Mild Anemia [Hb 11.5-12; since 2021] normal MCV- ? Symptomatic- mild fatigue; DEC 2023-iron saturation 15%: Ferritin 26.  B12/folic acid/M protein negative; kappa lambda light chain ratio slightly abnormal at 2.6.    # Today hemoglobin is 10.8; MCV normal.  Discussed regarding a bone marrow biopsy for further evaluation.  However patient is reluctant.  I think is reasonable to hold bone marrow biopsy especially as patient is asymptomatic with her mild anemia.  However monitor closely in 3 months.   # Incidental CT-March 2024 [Dr.Fleming-COPD] Subcentimeter lung nodules [chronic but slowly growing-compared to 2020 CT scan ] - APRIL 2024-PET scan negative for any uptake-which is reassuring.  However increase  in size of Central left lower lobe 0.7 cm solid pulmonary nodule [in 2020 being 3 mm]; other stable lung nodules bilaterally.  Also new to 3 mm lung nodule in recent CT scan. However since the nodule has gotten bigger over the last few years-recommend continued surveillance with pulmonary, Dr. Meredeth Ide.  Will order imaging in 6 months.  Order next visit.   # Hx of Left mastectomy + ALND [1990]- No evidence of any Lymphedema/IV access; only Right PIV- stable.  Low clinical concerns for any recurrent malignancy.  However monitor closely with upper lung nodules.  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 Low clinical concerns for any recurrent malignancy.  However monitor closely with upper lung nodules.  #Incidental findings on Imaging  PET-CT , 2024: Atherosclerosis; diverticulosis I reviewed/discussed/counseled the patient.   # DISPOSITION: # follow up in 3 months-MD; labs- cbc-bmp; LDH-/iron studies; ferritin---Dr.B  # I reviewed the blood work- with the patient in detail; also reviewed the imaging independently [as summarized above]; and with the patient in  detail.    All questions were answered. The patient knows to call the clinic with any problems, questions or concerns.  Earna Coder, MD 08/11/2022 4:30 PM

## 2022-08-11 NOTE — Progress Notes (Signed)
Pt has COPD as well.  Non productive cough and has dyspnea from the COPD. Appetite is fine. Bowels are normal. Energy is okay. Here for her scan results.

## 2022-08-11 NOTE — Assessment & Plan Note (Addendum)
#   Mild Anemia [Hb 11.5-12; since 2021] normal MCV- ? Symptomatic- mild fatigue; DEC 2023-iron saturation 15%: Ferritin 26.  B12/folic acid/M protein negative; kappa lambda light chain ratio slightly abnormal at 2.6.    # Today hemoglobin is 10.8; MCV normal.  Discussed regarding a bone marrow biopsy for further evaluation.  However patient is reluctant.  I think is reasonable to hold bone marrow biopsy especially as patient is asymptomatic with her mild anemia.  However monitor closely in 3 months.   # Incidental CT-March 2024 [Dr.Fleming-COPD] Subcentimeter lung nodules [chronic but slowly growing-compared to 2020 CT scan ] - APRIL 2024-PET scan negative for any uptake-which is reassuring.  However increase in size of Central left lower lobe 0.7 cm solid pulmonary nodule [in 2020 being 3 mm]; other stable lung nodules bilaterally.  Also new to 3 mm lung nodule in recent CT scan. However since the nodule has gotten bigger over the last few years-recommend continued surveillance with pulmonary, Dr. Meredeth Ide.  Will order imaging in 6 months.  Order next visit.   # Hx of Left mastectomy + ALND [1990]- No evidence of any Lymphedema/IV access; only Right PIV- stable.  Low clinical concerns for any recurrent malignancy.  However monitor closely with upper lung nodules.  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 Low clinical concerns for any recurrent malignancy.  However monitor closely with upper lung nodules.  #Incidental findings on Imaging  PET-CT , 2024: Atherosclerosis; diverticulosis I reviewed/discussed/counseled the patient.   # DISPOSITION: # follow up in 3 months-MD; labs- cbc-bmp; LDH-/iron studies; ferritin---Dr.B  # I reviewed the blood work- with the patient in detail; also reviewed the imaging independently [as summarized above]; and with the patient in detail.

## 2022-08-28 ENCOUNTER — Ambulatory Visit: Payer: Medicare Other | Admitting: Internal Medicine

## 2022-08-28 ENCOUNTER — Other Ambulatory Visit: Payer: Medicare Other

## 2022-11-13 ENCOUNTER — Inpatient Hospital Stay (HOSPITAL_BASED_OUTPATIENT_CLINIC_OR_DEPARTMENT_OTHER): Payer: Medicare Other | Admitting: Internal Medicine

## 2022-11-13 ENCOUNTER — Inpatient Hospital Stay: Payer: Medicare Other | Attending: Internal Medicine

## 2022-11-13 VITALS — BP 137/68 | HR 80 | Temp 98.9°F | Resp 18 | Wt 152.2 lb

## 2022-11-13 DIAGNOSIS — Z923 Personal history of irradiation: Secondary | ICD-10-CM | POA: Diagnosis not present

## 2022-11-13 DIAGNOSIS — D649 Anemia, unspecified: Secondary | ICD-10-CM | POA: Insufficient documentation

## 2022-11-13 DIAGNOSIS — Z9221 Personal history of antineoplastic chemotherapy: Secondary | ICD-10-CM | POA: Insufficient documentation

## 2022-11-13 DIAGNOSIS — Z87891 Personal history of nicotine dependence: Secondary | ICD-10-CM | POA: Diagnosis not present

## 2022-11-13 DIAGNOSIS — Z853 Personal history of malignant neoplasm of breast: Secondary | ICD-10-CM | POA: Diagnosis present

## 2022-11-13 DIAGNOSIS — Z902 Acquired absence of lung [part of]: Secondary | ICD-10-CM | POA: Diagnosis not present

## 2022-11-13 DIAGNOSIS — Z85118 Personal history of other malignant neoplasm of bronchus and lung: Secondary | ICD-10-CM | POA: Diagnosis present

## 2022-11-13 DIAGNOSIS — R918 Other nonspecific abnormal finding of lung field: Secondary | ICD-10-CM | POA: Diagnosis not present

## 2022-11-13 LAB — CBC WITH DIFFERENTIAL (CANCER CENTER ONLY)
Abs Immature Granulocytes: 0.03 10*3/uL (ref 0.00–0.07)
Basophils Absolute: 0 10*3/uL (ref 0.0–0.1)
Basophils Relative: 1 %
Eosinophils Absolute: 0.1 10*3/uL (ref 0.0–0.5)
Eosinophils Relative: 2 %
HCT: 32.5 % — ABNORMAL LOW (ref 36.0–46.0)
Hemoglobin: 10.4 g/dL — ABNORMAL LOW (ref 12.0–15.0)
Immature Granulocytes: 1 %
Lymphocytes Relative: 39 %
Lymphs Abs: 1.9 10*3/uL (ref 0.7–4.0)
MCH: 28.6 pg (ref 26.0–34.0)
MCHC: 32 g/dL (ref 30.0–36.0)
MCV: 89.3 fL (ref 80.0–100.0)
Monocytes Absolute: 0.4 10*3/uL (ref 0.1–1.0)
Monocytes Relative: 7 %
Neutro Abs: 2.5 10*3/uL (ref 1.7–7.7)
Neutrophils Relative %: 50 %
Platelet Count: 356 10*3/uL (ref 150–400)
RBC: 3.64 MIL/uL — ABNORMAL LOW (ref 3.87–5.11)
RDW: 12.8 % (ref 11.5–15.5)
WBC Count: 5 10*3/uL (ref 4.0–10.5)
nRBC: 0 % (ref 0.0–0.2)

## 2022-11-13 LAB — IRON AND TIBC
Iron: 135 ug/dL (ref 28–170)
Saturation Ratios: 31 % (ref 10.4–31.8)
TIBC: 441 ug/dL (ref 250–450)
UIBC: 306 ug/dL

## 2022-11-13 LAB — BASIC METABOLIC PANEL - CANCER CENTER ONLY
Anion gap: 10 (ref 5–15)
BUN: 12 mg/dL (ref 8–23)
CO2: 28 mmol/L (ref 22–32)
Calcium: 8.6 mg/dL — ABNORMAL LOW (ref 8.9–10.3)
Chloride: 101 mmol/L (ref 98–111)
Creatinine: 0.9 mg/dL (ref 0.44–1.00)
GFR, Estimated: >60 mL/min (ref >60)
Glucose, Bld: 224 mg/dL — ABNORMAL HIGH (ref 70–99)
Potassium: 3.7 mmol/L (ref 3.5–5.1)
Sodium: 139 mmol/L (ref 135–145)

## 2022-11-13 LAB — FERRITIN: Ferritin: 45 ng/mL (ref 11–307)

## 2022-11-13 LAB — LACTATE DEHYDROGENASE: LDH: 120 U/L (ref 98–192)

## 2022-11-13 NOTE — Assessment & Plan Note (Addendum)
#   Mild Anemia [Hb 11.5-12; since 2021] normal MCV- ? Symptomatic- mild fatigue; DEC 2023-iron saturation 15%: Ferritin 26.  B12/folic acid/M protein negative; kappa lambda light chain ratio slightly abnormal at 2.6.    # Today hemoglobin is 10.6 ; MCV normal. Ok to come off the iron pill.   Discussed regarding a bone marrow biopsy for further evaluation. Discussed with the patient the bone marrow biopsy and aspiration indication and procedure at length.  Given significant discomfort involved-I would recommend under anesthesia/with radiology in the hospital. For now I think is reasonable to hold bone marrow biopsy especially as patient is asymptomatic with her mild anemia.  However monitor closely in 3 months. Overall stable.    # Incidental CT-March 2024 [Dr.Fleming-COPD] Subcentimeter lung nodules [chronic but slowly growing-compared to 2020 CT scan ] - APRIL 2024-PET scan negative for any uptake-which is reassuring.  However increase in size of Central left lower lobe 0.7 cm solid pulmonary nodule [in 2020 being 3 mm]; other stable lung nodules bilaterally.  Also new to 3 mm lung nodule in recent CT scan  Will order imaging in 6 months.  Ordered  today.  Stable.   # Hx of lumpectomy- mastectomy + ALND [1990]- No evidence of any Lymphedema/IV access;  Low clinical concerns for any recurrent malignancy.  However monitor closely with upper lung nodules. Stable. 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 Low clinical concerns for any recurrent malignancy.  However monitor closely with upper lung nodules.Stable.   # DISPOSITION: # follow up in 2nd week of NOV-MD; labs- cbc-bmp; LDH-/; CT chest prior---Dr.B

## 2022-11-13 NOTE — Progress Notes (Signed)
COPD; watching lung nodules. Anemia, denies dizziness. No fatigue. Very active. No blood in stools.

## 2022-11-13 NOTE — Progress Notes (Signed)
[facility] CONSULT NOTE  Patient Care Team: Marguarite Arbour, MD as PCP - General (Internal Medicine)  CHIEF COMPLAINTS/PURPOSE OF CONSULTATION: ANEMIA  HEMATOLOGY HISTORY  # 1990- left breast cancer- s/p lumpectomy; chemo- RT; no pills [ER- NEG]; Ruffin Frederick.   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  # ANEMIA[Hb; MCV-platelets- WBC; Iron sat; ferritin;  GFR- CT/US; EGD- none/colonoscopy-2023-Feb- NEG-     Latest Reference Range & Units 04/03/22 12:05  Kappa free light chain 3.3 - 19.4 mg/L 15.0  Lambda free light chains 5.7 - 26.3 mg/L 6.6  Kappa, lambda light chain ratio 0.26 - 1.65  2.27 (H)  (H): Data is abnormally high   Latest Reference Range & Units 04/03/22 12:05  LDH 98 - 192 U/L 118  Iron 28 - 170 ug/dL 59  UIBC ug/dL 098  TIBC 119 - 147 ug/dL 829  Saturation Ratios 10.4 - 31.8 % 15  Ferritin 11 - 307 ng/mL 23  Folate >5.9 ng/mL 13.9  Vitamin B12 180 - 914 pg/mL 594   HISTORY OF PRESENTING ILLNESS: Alone.  Ambulating independently.   Monica Baxter 77 y.o.  female pleasant patient with mild anemia noted to have incidental lung nodules is here to follow-up.  Patient denies dizziness. No fatigue. Very active. No blood in stools. Patient denies any worsening fatigue.    No nausea no vomiting.   SHe had no worsening cough or shortness of breath.  Review of Systems  Constitutional:  Positive for malaise/fatigue. Negative for chills, diaphoresis, fever and weight loss.  HENT:  Negative for nosebleeds and sore throat.   Eyes:  Negative for double vision.  Respiratory:  Negative for hemoptysis, sputum production, shortness of breath and wheezing.   Cardiovascular:  Negative for chest pain, palpitations, orthopnea and leg swelling.  Gastrointestinal:  Negative for abdominal pain, blood in stool, constipation, diarrhea, heartburn, melena, nausea and vomiting.  Genitourinary:  Negative for  dysuria, frequency and urgency.  Musculoskeletal:  Negative for back pain and joint pain.  Skin: Negative.  Negative for itching and rash.  Neurological:  Negative for dizziness, tingling, focal weakness, weakness and headaches.  Endo/Heme/Allergies:  Does not bruise/bleed easily.  Psychiatric/Behavioral:  Negative for depression. The patient is not nervous/anxious and does not have insomnia.     MEDICAL HISTORY:  Past Medical History:  Diagnosis Date   Anemia    Breast cancer (HCC) 1990   left   Cancer (HCC)    breast    Hypertension    Personal history of chemotherapy    Personal history of radiation therapy     SURGICAL HISTORY: Past Surgical History:  Procedure Laterality Date   APPENDECTOMY  2015   BREAST BIOPSY     BREAST EXCISIONAL BIOPSY     BREAST LUMPECTOMY Left 1990   CHOLECYSTECTOMY  1995   COLONOSCOPY  2017   HERNIA REPAIR  2016   umbilical    LOBECTOMY Bilateral 2002,2015    SOCIAL HISTORY: Social History   Socioeconomic History   Marital status: Married    Spouse name: Not on file   Number of children: Not on file   Years of education: Not on file   Highest education level: Not on file  Occupational History   Not on file  Tobacco Use   Smoking status: Former    Current packs/day: 0.00    Types: Cigarettes    Quit date: 1982    Years since  quitting: 42.5   Smokeless tobacco: Never  Substance and Sexual Activity   Alcohol use: Yes    Alcohol/week: 2.0 standard drinks of alcohol    Types: 2 Glasses of wine per week   Drug use: Never   Sexual activity: Not on file  Other Topics Concern   Not on file  Social History Narrative   Not on file   Social Determinants of Health   Financial Resource Strain: Not on file  Food Insecurity: Not on file  Transportation Needs: Not on file  Physical Activity: Not on file  Stress: Not on file  Social Connections: Not on file  Intimate Partner Violence: Not on file    FAMILY HISTORY: Family History   Adopted: Yes  Family history unknown: Yes    ALLERGIES:  is allergic to penicillins, codeine, and hydromorphone hcl.  MEDICATIONS:  Current Outpatient Medications  Medication Sig Dispense Refill   amLODipine (NORVASC) 10 MG tablet      Cholecalciferol (VITAMIN D3) 1000 units CAPS Take 1 tablet by mouth daily.     cyanocobalamin (VITAMIN B12) 1000 MCG tablet Take 1,000 mcg by mouth daily.     Fluticasone-Umeclidin-Vilant (TRELEGY ELLIPTA) 100-62.5-25 MCG/ACT AEPB Inhale 1 puff into the lungs daily.     gabapentin (NEURONTIN) 600 MG tablet Take by mouth.     omeprazole (PRILOSEC) 20 MG capsule Take by mouth.     raloxifene (EVISTA) 60 MG tablet      solifenacin (VESICARE) 5 MG tablet      No current facility-administered medications for this visit.     Marland Kitchen  PHYSICAL EXAMINATION:   Vitals:   11/13/22 1023  BP: 137/68  Pulse: 80  Resp: 18  Temp: 98.9 F (37.2 C)  SpO2: 100%    Filed Weights   11/13/22 1023  Weight: 152 lb 3.2 oz (69 kg)     Physical Exam Vitals and nursing note reviewed.  HENT:     Head: Normocephalic and atraumatic.     Mouth/Throat:     Pharynx: Oropharynx is clear.  Eyes:     Extraocular Movements: Extraocular movements intact.     Pupils: Pupils are equal, round, and reactive to light.  Cardiovascular:     Rate and Rhythm: Normal rate and regular rhythm.  Pulmonary:     Comments: Decreased breath sounds bilaterally.  Abdominal:     Palpations: Abdomen is soft.  Musculoskeletal:        General: Normal range of motion.     Cervical back: Normal range of motion.  Skin:    General: Skin is warm.  Neurological:     General: No focal deficit present.     Mental Status: She is alert and oriented to person, place, and time.  Psychiatric:        Behavior: Behavior normal.        Judgment: Judgment normal.      LABORATORY DATA:  I have reviewed the data as listed Lab Results  Component Value Date   WBC 5.0 11/13/2022   HGB 10.4 (L)  11/13/2022   HCT 32.5 (L) 11/13/2022   MCV 89.3 11/13/2022   PLT 356 11/13/2022   Recent Labs    11/13/22 1011  NA 139  K 3.7  CL 101  CO2 28  GLUCOSE 224*  BUN 12  CREATININE 0.90  CALCIUM 8.6*  GFRNONAA >60     No results found.  ASSESSMENT & PLAN:   Symptomatic anemia # Mild Anemia [Hb 11.5-12; since 2021]  normal MCV- ? Symptomatic- mild fatigue; DEC 2023-iron saturation 15%: Ferritin 26.  B12/folic acid/M protein negative; kappa lambda light chain ratio slightly abnormal at 2.6.    # Today hemoglobin is 10.6 ; MCV normal. Ok to come off the iron pill.   Discussed regarding a bone marrow biopsy for further evaluation. Discussed with the patient the bone marrow biopsy and aspiration indication and procedure at length.  Given significant discomfort involved-I would recommend under anesthesia/with radiology in the hospital. For now I think is reasonable to hold bone marrow biopsy especially as patient is asymptomatic with her mild anemia.  However monitor closely in 3 months. Overall stable.    # Incidental CT-March 2024 [Dr.Fleming-COPD] Subcentimeter lung nodules [chronic but slowly growing-compared to 2020 CT scan ] - APRIL 2024-PET scan negative for any uptake-which is reassuring.  However increase in size of Central left lower lobe 0.7 cm solid pulmonary nodule [in 2020 being 3 mm]; other stable lung nodules bilaterally.  Also new to 3 mm lung nodule in recent CT scan  Will order imaging in 6 months.  Ordered  today.  Stable.   # Hx of lumpectomy- mastectomy + ALND [1990]- No evidence of any Lymphedema/IV access;  Low clinical concerns for any recurrent malignancy.  However monitor closely with upper lung nodules. Stable. 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 Low clinical concerns for any recurrent malignancy.  However monitor closely with upper lung  nodules.Stable.   # DISPOSITION: # follow up in 2nd week of NOV-MD; labs- cbc-bmp; LDH-/; CT chest prior---Dr.B    All questions were answered. The patient knows to call the clinic with any problems, questions or concerns.  Earna Coder, MD 11/13/2022 11:32 AM

## 2023-02-27 ENCOUNTER — Ambulatory Visit
Admission: RE | Admit: 2023-02-27 | Discharge: 2023-02-27 | Disposition: A | Discharge status: Home / Self Care | Payer: Medicare Other | Source: Ambulatory Visit | Attending: Internal Medicine | Admitting: Internal Medicine

## 2023-02-27 DIAGNOSIS — R918 Other nonspecific abnormal finding of lung field: Secondary | ICD-10-CM | POA: Diagnosis present

## 2023-02-27 MED ORDER — IOHEXOL 300 MG/ML  SOLN
75.0000 mL | Freq: Once | INTRAMUSCULAR | Status: AC | PRN
  Administered 2023-02-27: 75 mL via INTRAVENOUS

## 2023-03-06 ENCOUNTER — Encounter: Payer: Self-pay | Admitting: Internal Medicine

## 2023-03-06 ENCOUNTER — Inpatient Hospital Stay (HOSPITAL_BASED_OUTPATIENT_CLINIC_OR_DEPARTMENT_OTHER): Payer: Medicare Other | Admitting: Internal Medicine

## 2023-03-06 ENCOUNTER — Inpatient Hospital Stay: Payer: Medicare Other | Attending: Internal Medicine

## 2023-03-06 ENCOUNTER — Telehealth: Payer: Self-pay | Admitting: *Deleted

## 2023-03-06 VITALS — BP 129/67 | HR 94 | Temp 98.6°F | Ht 66.0 in | Wt 151.8 lb

## 2023-03-06 DIAGNOSIS — Z85118 Personal history of other malignant neoplasm of bronchus and lung: Secondary | ICD-10-CM | POA: Diagnosis not present

## 2023-03-06 DIAGNOSIS — R918 Other nonspecific abnormal finding of lung field: Secondary | ICD-10-CM | POA: Diagnosis not present

## 2023-03-06 DIAGNOSIS — Z9221 Personal history of antineoplastic chemotherapy: Secondary | ICD-10-CM | POA: Diagnosis not present

## 2023-03-06 DIAGNOSIS — Z87891 Personal history of nicotine dependence: Secondary | ICD-10-CM | POA: Insufficient documentation

## 2023-03-06 DIAGNOSIS — D649 Anemia, unspecified: Secondary | ICD-10-CM | POA: Insufficient documentation

## 2023-03-06 DIAGNOSIS — Z923 Personal history of irradiation: Secondary | ICD-10-CM | POA: Diagnosis not present

## 2023-03-06 DIAGNOSIS — Z853 Personal history of malignant neoplasm of breast: Secondary | ICD-10-CM | POA: Insufficient documentation

## 2023-03-06 LAB — CBC WITH DIFFERENTIAL (CANCER CENTER ONLY)
Abs Immature Granulocytes: 0.03 10*3/uL (ref 0.00–0.07)
Basophils Absolute: 0 10*3/uL (ref 0.0–0.1)
Basophils Relative: 1 %
Eosinophils Absolute: 0.1 10*3/uL (ref 0.0–0.5)
Eosinophils Relative: 2 %
HCT: 32.8 % — ABNORMAL LOW (ref 36.0–46.0)
Hemoglobin: 10.4 g/dL — ABNORMAL LOW (ref 12.0–15.0)
Immature Granulocytes: 1 %
Lymphocytes Relative: 32 %
Lymphs Abs: 1.6 10*3/uL (ref 0.7–4.0)
MCH: 27.9 pg (ref 26.0–34.0)
MCHC: 31.7 g/dL (ref 30.0–36.0)
MCV: 87.9 fL (ref 80.0–100.0)
Monocytes Absolute: 0.4 10*3/uL (ref 0.1–1.0)
Monocytes Relative: 8 %
Neutro Abs: 2.9 10*3/uL (ref 1.7–7.7)
Neutrophils Relative %: 56 %
Platelet Count: 379 10*3/uL (ref 150–400)
RBC: 3.73 MIL/uL — ABNORMAL LOW (ref 3.87–5.11)
RDW: 12.9 % (ref 11.5–15.5)
WBC Count: 5 10*3/uL (ref 4.0–10.5)
nRBC: 0 % (ref 0.0–0.2)

## 2023-03-06 LAB — BASIC METABOLIC PANEL - CANCER CENTER ONLY
Anion gap: 7 (ref 5–15)
BUN: 14 mg/dL (ref 8–23)
CO2: 27 mmol/L (ref 22–32)
Calcium: 9.4 mg/dL (ref 8.9–10.3)
Chloride: 103 mmol/L (ref 98–111)
Creatinine: 0.58 mg/dL (ref 0.44–1.00)
GFR, Estimated: >60 mL/min (ref >60)
Glucose, Bld: 93 mg/dL (ref 70–99)
Potassium: 4.5 mmol/L (ref 3.5–5.1)
Sodium: 137 mmol/L (ref 135–145)

## 2023-03-06 LAB — LACTATE DEHYDROGENASE: LDH: 116 U/L (ref 98–192)

## 2023-03-06 NOTE — Assessment & Plan Note (Addendum)
#   Incidental CT-March 2024 [Dr.Fleming-COPD] Subcentimeter lung nodules [chronic but slowly growing-compared to 2020 CT scan ] - APRIL 2024-PET scan negative for any uptake-which is reassuring.  However increase in size of Central left lower lobe 0.7 cm solid pulmonary nodule [in 2020 being 3 mm]; other stable lung nodules bilaterally.  Also new to 3 mm lung nodule in recent CT scan.  Continued increasing close of lung nodules noticed on the recent scan from NOV 11th, 2024- Multiple bilateral lung nodules. There are are few nodule which has slightly increased in size such as left lower lobe nodule seen previously now measuring 9 mm as opposed to previous measurement of 7 mm. There is 1 nodule in particular in the right upper lobe which is significantly increased in size and has a more ground-glass and spiculated now measuring 7 mm and previously 3 mm. This has more worrisome for potential neoplasm. Recommend further workup.   Recommended PET scan.  Discussed with Dr.A- we will have the office call patient to have further discussion.  # Mild Anemia [Hb 11.5-12; since 2021] normal MCV- ? Symptomatic- mild fatigue; DEC 2023-iron saturation 15%: Ferritin 26.  B12/folic acid/M protein negative; kappa lambda light chain ratio slightly abnormal at 2.6.   Today hemoglobin is 10.5 ; MCV normal. Ok to come off the iron pill.   Hold off any further evaluation including bone marrow at this time.  # Hx of lumpectomy- mastectomy + ALND [1990]- No evidence of any Lymphedema/IV access;  Low clinical concerns for any recurrent malignancy.  However monitor closely with upper lung nodules.-Await above workup.  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 Low clinical concerns for any recurrent malignancy-again await above workup.  # Expectedly patient was quite tearful and upset given possibility of malignancy noted on imaging.   Discussed once were able to better assess the lung nodules-she would have an option of going to [place] or [place] for further opinions if interested.  At this time await above workup. Discussed with Hayley.   # DISPOSITION: # PET scan ASAP # follow up in 3 weeks- MD- No labs--Dr.B  # I reviewed the blood work- with the patient in detail; also reviewed the imaging independently [as summarized above]; and with the patient in detail.  '

## 2023-03-06 NOTE — Progress Notes (Signed)
[facility] CONSULT NOTE  Patient Care Team: Marguarite Arbour, MD as PCP - General (Internal Medicine) Earna Coder, MD as Consulting Physician (Oncology)  CHIEF COMPLAINTS/PURPOSE OF CONSULTATION: ANEMIA  HEMATOLOGY HISTORY  # 1990- left breast cancer- s/p lumpectomy; chemo- RT; no pills [ER- NEG]; [facility]uffin [facility].   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  # ANEMIA[Hb; MCV-platelets- WBC; Iron sat; ferritin;  GFR- CT/US; EGD- none/colonoscopy-2023-Feb- NEG-     Latest Reference Range & Units 04/03/22 12:05  Kappa free light chain 3.3 - 19.4 mg/L 15.0  Lambda free light chains 5.7 - 26.3 mg/L 6.6  Kappa, lambda light chain ratio 0.26 - 1.65  2.27 (H)  (H): Data is abnormally high   Latest Reference Range & Units 04/03/22 12:05  LDH 98 - 192 U/L 118  Iron 28 - 170 ug/dL 59  UIBC ug/dL 098  TIBC 119 - 147 ug/dL 829  Saturation Ratios 10.4 - 31.8 % 15  Ferritin 11 - 307 ng/mL 23  Folate >5.9 ng/mL 13.9  Vitamin B12 180 - 914 pg/mL 594   HISTORY OF PRESENTING ILLNESS: Alone.  Ambulating independently.   Monica Baxter 77 y.o.  female pleasant patient with mild anemia noted to have incidental lung nodules is here to follow-up/and review results of the CT scan.  Patient denies any new symptoms.  Denies any headaches. SHe had no worsening cough or shortness of breath.  Review of Systems  Constitutional:  Positive for malaise/fatigue. Negative for chills, diaphoresis, fever and weight loss.  HENT:  Negative for nosebleeds and sore throat.   Eyes:  Negative for double vision.  Respiratory:  Negative for hemoptysis, sputum production, shortness of breath and wheezing.   Cardiovascular:  Negative for chest pain, palpitations, orthopnea and leg swelling.  Gastrointestinal:  Negative for abdominal pain, blood in stool, constipation, diarrhea, heartburn, melena, nausea and vomiting.  Genitourinary:   Negative for dysuria, frequency and urgency.  Musculoskeletal:  Negative for back pain and joint pain.  Skin: Negative.  Negative for itching and rash.  Neurological:  Negative for dizziness, tingling, focal weakness, weakness and headaches.  Endo/Heme/Allergies:  Does not bruise/bleed easily.  Psychiatric/Behavioral:  Negative for depression. The patient is not nervous/anxious and does not have insomnia.     MEDICAL HISTORY:  Past Medical History:  Diagnosis Date   Anemia    Breast cancer (HCC) 1990   left   Cancer (HCC)    breast    Hypertension    Personal history of chemotherapy    Personal history of radiation therapy     SURGICAL HISTORY: Past Surgical History:  Procedure Laterality Date   APPENDECTOMY  2015   BREAST BIOPSY     BREAST EXCISIONAL BIOPSY     BREAST LUMPECTOMY Left 1990   CHOLECYSTECTOMY  1995   COLONOSCOPY  2017   HERNIA REPAIR  2016   umbilical    LOBECTOMY Bilateral 2002,2015    SOCIAL HISTORY: Social History   Socioeconomic History   Marital status: Married    Spouse name: Not on file   Number of children: Not on file   Years of education: Not on file   Highest education level: Not on file  Occupational History   Not on file  Tobacco Use   Smoking status: Former    Current packs/day: 0.00    Types: Cigarettes    Quit date: 1982    Years since quitting: 42.8  Smokeless tobacco: Never  Substance and Sexual Activity   Alcohol use: Yes    Alcohol/week: 2.0 standard drinks of alcohol    Types: 2 Glasses of wine per week   Drug use: Never   Sexual activity: Not on file  Other Topics Concern   Not on file  Social History Narrative   Not on file   Social Determinants of Health   Financial Resource Strain: Low Risk  (01/01/2023)   Received from [facility]   Overall Financial Resource Strain (CARDIA)    Difficulty of Paying Living Expenses: Not hard at all  Food Insecurity: No Food Insecurity (01/01/2023)   Received  from [facility]   Hunger Vital Sign    Worried About Running Out of Food in the Last Year: Never true    Ran Out of Food in the Last Year: Never true  Transportation Needs: No Transportation Needs (01/01/2023)   Received from [facility] - Transportation    In the past 12 months, has lack of transportation kept you from medical appointments or from getting medications?: No    Lack of Transportation (Non-Medical): No  Physical Activity: Not on file  Stress: Not on file  Social Connections: Not on file  Intimate Partner Violence: Not on file    FAMILY HISTORY: Family History  Adopted: Yes  Family history unknown: Yes    ALLERGIES:  is allergic to penicillins, codeine, and hydromorphone hcl.  MEDICATIONS:  Current Outpatient Medications  Medication Sig Dispense Refill   amLODipine (NORVASC) 10 MG tablet      Cholecalciferol (VITAMIN D3) 1000 units CAPS Take 1 tablet by mouth daily.     cyanocobalamin (VITAMIN B12) 1000 MCG tablet Take 1,000 mcg by mouth daily.     Fluticasone-Umeclidin-Vilant (TRELEGY ELLIPTA) 100-62.5-25 MCG/ACT AEPB Inhale 1 puff into the lungs daily.     gabapentin (NEURONTIN) 600 MG tablet Take by mouth.     omeprazole (PRILOSEC) 20 MG capsule Take by mouth.     raloxifene (EVISTA) 60 MG tablet      solifenacin (VESICARE) 5 MG tablet      No current facility-administered medications for this visit.     Marland Kitchen  PHYSICAL EXAMINATION:   Vitals:   03/06/23 1017  BP: 129/67  Pulse: 94  Temp: 98.6 F (37 C)  SpO2: 99%    Filed Weights   03/06/23 1017  Weight: 151 lb 12.8 oz (68.9 kg)     Physical Exam Vitals and nursing note reviewed.  HENT:     Head: Normocephalic and atraumatic.     Mouth/Throat:     Pharynx: Oropharynx is clear.  Eyes:     Extraocular Movements: Extraocular movements intact.     Pupils: Pupils are equal, round, and reactive to light.  Cardiovascular:     Rate and Rhythm: Normal  rate and regular rhythm.  Pulmonary:     Comments: Decreased breath sounds bilaterally.  Abdominal:     Palpations: Abdomen is soft.  Musculoskeletal:        General: Normal range of motion.     Cervical back: Normal range of motion.  Skin:    General: Skin is warm.  Neurological:     General: No focal deficit present.     Mental Status: She is alert and oriented to person, place, and time.  Psychiatric:        Behavior: Behavior normal.        Judgment: Judgment  normal.      LABORATORY DATA:  I have reviewed the data as listed Lab Results  Component Value Date   WBC 5.0 03/06/2023   HGB 10.4 (L) 03/06/2023   HCT 32.8 (L) 03/06/2023   MCV 87.9 03/06/2023   PLT 379 03/06/2023   Recent Labs    11/13/22 1011 03/06/23 1008  NA 139 137  K 3.7 4.5  CL 101 103  CO2 28 27  GLUCOSE 224* 93  BUN 12 14  CREATININE 0.90 0.58  CALCIUM 8.6* 9.4  GFRNONAA >60 >60     CT CHEST W CONTRAST  Result Date: 03/05/2023 CLINICAL DATA:  COPD. History of left breast cancer with prior radiation and chemotherapy. Lung nodules. * Tracking Code: BO * EXAM: CT CHEST WITH CONTRAST TECHNIQUE: Multidetector CT imaging of the chest was performed during intravenous contrast administration. RADIATION DOSE REDUCTION: This exam was performed according to the departmental dose-optimization program which includes automated exposure control, adjustment of the mA and/or kV according to patient size and/or use of iterative reconstruction technique. CONTRAST:  75mL OMNIPAQUE IOHEXOL 300 MG/ML  SOLN COMPARISON:  PET-CT 08/08/2022.  Chest CT 07/13/2022 FINDINGS: Cardiovascular: Heart is nonenlarged. No pericardial effusion. Coronary artery calcifications are seen. The thoracic aorta has a normal course and caliber with scattered atherosclerotic calcified plaque. There is a bovine type aortic arch. Mediastinum/Nodes: Small hiatal hernia. Preserved thyroid gland. No specific abnormal lymph node enlargement identified  in the axillary regions, hilum or mediastinum. Lungs/Pleura: Increasing lower anterior lung scarring and fibrotic changes identified in the middle lobe and lingula. No separate consolidation, pneumothorax or effusion. Once again there are several nodules identified. These are appear to be increasing in size and number slightly from the previous CT scan of March 2024. For example the 7 mm nodule seen left lower lobe, today on series 3, image 68 measures 9 mm. The right upper lobe nodule which previously measured 3 mm, today is much larger and spiculated with some ground-glass on series 3, image 32 measuring 7 mm. This has also significantly increased in size from the PET-CT scan of April 2024. Right lower lobe nodule measuring 5 mm on series 3, image 71 is similar to the previous which had measured 5 mm as well. There are some other nodules which are similar. Upper Abdomen: Adrenal glands are preserved in the upper abdomen. Musculoskeletal: Curvature and degenerative changes along the spine. IMPRESSION: Multiple bilateral lung nodules. There are are few nodule which has slightly increased in size such as left lower lobe nodule seen previously now measuring 9 mm as opposed to previous measurement of 7 mm. There is 1 nodule in particular in the right upper lobe which is significantly increased in size and has a more ground-glass and spiculated now measuring 7 mm and previously 3 mm. This has more worrisome for potential neoplasm. Recommend further workup. Repeat PET-CT may be of some benefit. The findings will be called to the ordering service by the Radiology physician assistant team. Aortic Atherosclerosis (ICD10-I70.0). Electronically Signed   By: Karen Kays M.D.   On: 03/05/2023 16:20    ASSESSMENT & PLAN:   Symptomatic anemia     Multiple lung nodules # Incidental CT-March 2024 [Dr.Fleming-COPD] Subcentimeter lung nodules [chronic but slowly growing-compared to 2020 CT scan ] - APRIL 2024-PET scan  negative for any uptake-which is reassuring.  However increase in size of Central left lower lobe 0.7 cm solid pulmonary nodule [in 2020 being 3 mm]; other stable lung nodules bilaterally.  Also new to 3 mm lung nodule in recent CT scan.  Continued increasing close of lung nodules noticed on the recent scan from NOV 11th, 2024- Multiple bilateral lung nodules. There are are few nodule which has slightly increased in size such as left lower lobe nodule seen previously now measuring 9 mm as opposed to previous measurement of 7 mm. There is 1 nodule in particular in the right upper lobe which is significantly increased in size and has a more ground-glass and spiculated now measuring 7 mm and previously 3 mm. This has more worrisome for potential neoplasm. Recommend further workup.   Recommended PET scan.  Discussed with Dr.A- we will have the office call patient to have further discussion.  # Mild Anemia [Hb 11.5-12; since 2021] normal MCV- ? Symptomatic- mild fatigue; DEC 2023-iron saturation 15%: Ferritin 26.  B12/folic acid/M protein negative; kappa lambda light chain ratio slightly abnormal at 2.6.   Today hemoglobin is 10.5 ; MCV normal. Ok to come off the iron pill.   Hold off any further evaluation including bone marrow at this time.  # Hx of lumpectomy- mastectomy + ALND [1990]- No evidence of any Lymphedema/IV access;  Low clinical concerns for any recurrent malignancy.  However monitor closely with upper lung nodules.-Await above workup.  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 Low clinical concerns for any recurrent malignancy-again await above workup.  # Expectedly patient was quite tearful and upset given possibility of malignancy noted on imaging.  Discussed once were able to better assess the lung nodules-she would have an option of going to [place] or [place] for further opinions if interested.  At  this time await above workup. Discussed with Hayley.   # DISPOSITION: # PET scan ASAP # follow up in 3 weeks- MD- No labs--Dr.B  # I reviewed the blood work- with the patient in detail; also reviewed the imaging independently [as summarized above]; and with the patient in detail.  '   All questions were answered. The patient knows to call the clinic with any problems, questions or concerns.  Earna Coder, MD 03/06/2023 3:25 PM

## 2023-03-06 NOTE — Telephone Encounter (Signed)
Called report  IMPRESSION: Multiple bilateral lung nodules. There are are few nodule which has slightly increased in size such as left lower lobe nodule seen previously now measuring 9 mm as opposed to previous measurement of 7 mm.   There is 1 nodule in particular in the right upper lobe which is significantly increased in size and has a more ground-glass and spiculated now measuring 7 mm and previously 3 mm. This has more worrisome for potential neoplasm. Recommend further workup. Repeat PET-CT may be of some benefit.   The findings will be called to the ordering service by the Radiology physician assistant team.   Aortic Atherosclerosis (ICD10-I70.0).     Electronically Signed   By: Karen Kays M.D.   On: 03/05/2023 16:20

## 2023-03-06 NOTE — Progress Notes (Signed)
CT Chest 02/27/23.

## 2023-03-08 ENCOUNTER — Other Ambulatory Visit: Payer: Medicare Other

## 2023-03-08 ENCOUNTER — Ambulatory Visit
Admission: RE | Admit: 2023-03-08 | Discharge: 2023-03-08 | Disposition: A | Discharge status: Home / Self Care | Payer: Medicare Other | Source: Ambulatory Visit | Attending: Internal Medicine | Admitting: Internal Medicine

## 2023-03-08 DIAGNOSIS — R918 Other nonspecific abnormal finding of lung field: Secondary | ICD-10-CM | POA: Diagnosis present

## 2023-03-08 DIAGNOSIS — I700 Atherosclerosis of aorta: Secondary | ICD-10-CM | POA: Insufficient documentation

## 2023-03-08 DIAGNOSIS — C50919 Malignant neoplasm of unspecified site of unspecified female breast: Secondary | ICD-10-CM | POA: Diagnosis not present

## 2023-03-08 DIAGNOSIS — I2510 Atherosclerotic heart disease of native coronary artery without angina pectoris: Secondary | ICD-10-CM | POA: Diagnosis not present

## 2023-03-08 LAB — GLUCOSE, CAPILLARY: Glucose-Capillary: 90 mg/dL (ref 70–99)

## 2023-03-08 MED ORDER — FLUDEOXYGLUCOSE F - 18 (FDG) INJECTION
7.8000 | Freq: Once | INTRAVENOUS | Status: AC | PRN
Start: 2023-03-08 — End: 2023-03-08
  Administered 2023-03-08: 8.5 via INTRAVENOUS

## 2023-03-20 ENCOUNTER — Telehealth: Payer: Self-pay | Admitting: *Deleted

## 2023-03-20 NOTE — Telephone Encounter (Signed)
error 

## 2023-03-26 ENCOUNTER — Inpatient Hospital Stay: Payer: Medicare Other | Attending: Internal Medicine | Admitting: Internal Medicine

## 2023-03-26 ENCOUNTER — Encounter: Payer: Self-pay | Admitting: Internal Medicine

## 2023-03-26 ENCOUNTER — Encounter: Payer: Self-pay | Admitting: *Deleted

## 2023-03-26 VITALS — BP 131/75 | HR 90 | Temp 98.6°F | Ht 66.0 in | Wt 153.0 lb

## 2023-03-26 DIAGNOSIS — Z923 Personal history of irradiation: Secondary | ICD-10-CM | POA: Insufficient documentation

## 2023-03-26 DIAGNOSIS — Z853 Personal history of malignant neoplasm of breast: Secondary | ICD-10-CM | POA: Diagnosis present

## 2023-03-26 DIAGNOSIS — R918 Other nonspecific abnormal finding of lung field: Secondary | ICD-10-CM | POA: Diagnosis not present

## 2023-03-26 DIAGNOSIS — Z85118 Personal history of other malignant neoplasm of bronchus and lung: Secondary | ICD-10-CM | POA: Insufficient documentation

## 2023-03-26 DIAGNOSIS — Z87891 Personal history of nicotine dependence: Secondary | ICD-10-CM | POA: Diagnosis not present

## 2023-03-26 DIAGNOSIS — D649 Anemia, unspecified: Secondary | ICD-10-CM | POA: Insufficient documentation

## 2023-03-26 DIAGNOSIS — Z9221 Personal history of antineoplastic chemotherapy: Secondary | ICD-10-CM | POA: Diagnosis not present

## 2023-03-26 NOTE — Assessment & Plan Note (Addendum)
#   Incidental CT-March 2024 [Dr.Fleming-COPD] Subcentimeter lung nodules [chronic but slowly growing-compared to 2020 CT scan ] - APRIL 2024-PET scan negative for any uptake-which is reassuring.  However increase in size of Central left lower lobe 0.7 cm solid pulmonary nodule [in 2020 being 3 mm]; other stable lung nodules bilaterally. - NOV 11th, 2024- Multiple bilateral lung nodules. There are are few nodule which has slightly increased in size such as left lower lobe nodule seen previously now measuring 9 mm as opposed to previous measurement of 7 mm. There is 1 nodule in particular in the right upper lobe which is significantly increased in size and has a more ground-glass and spiculated now measuring 7 mm and previously 3 mm. This has more worrisome for potential neoplasm. 03/21/2023- PET scan: Improving right middle lobe and lingular peri-bronchovascular nodularity, indicative of an atypical infectious process.  Additional scattered borderline hypermetabolic well-circumscribed hematogenously distributed pulmonary nodules are worrisome for metastatic disease. Discussed with Dr.A. Recommend further workup/ follow up with Dr.Fleming.   # Given the overall small lung nodules I doubt if patient would get a biopsy.  Patient not too keen on open biopsy which I agree.  Again await evaluation with Dr. Meredeth Ide tomorrow.  Will also discuss at tumor conference.  Discussed with Rolly Salter.  # Mild Anemia [Hb 11.5-12; since 2021] normal MCV- ? Symptomatic- mild fatigue; DEC 2023-iron saturation 15%: Ferritin 26.  B12/folic acid/M protein negative; kappa lambda light chain ratio slightly abnormal at 2.6.   Today hemoglobin is 10.5 ; MCV normal. Ok to come off the iron pill.   Hold off any further evaluation including bone marrow at this time.  # Hx of lumpectomy- mastectomy + ALND [1990]- No evidence of any Lymphedema/IV access;  Low clinical concerns for any recurrent malignancy.  However monitor closely with upper lung  nodules.-Await above workup.  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 Low clinical concerns for any recurrent malignancy-again await above workup.  # Right upper extremity dysplastic nevi-recommend follow-up with dermatology recommendations regarding excision.  # DISPOSITION: # follow up TBD- --Dr.B  # I reviewed the blood work- with the patient in detail; also reviewed the imaging independently [as summarized above]; and with the patient in detail.

## 2023-03-26 NOTE — Progress Notes (Signed)
PET 2 weeks ago.

## 2023-03-26 NOTE — Progress Notes (Signed)
Met with patient during follow up visit with Dr. Donneta Romberg. All questions answered during visit. Informed that further follow up will be scheduled based on recommendations received from pulmonary. Pt scheduled to follow up with Dr. Meredeth Ide tomorrow afternoon. Will follow up on recommendations from visit and notify Dr. Valerie Salts info given and instructed to call with any questions or needs. Pt verbalized understanding.

## 2023-03-26 NOTE — Progress Notes (Signed)
[facility] CONSULT NOTE  Patient Care Team: Marguarite Arbour, MD as PCP - General (Internal Medicine) Earna Coder, MD as Consulting Physician (Oncology)  CHIEF COMPLAINTS/PURPOSE OF CONSULTATION: ANEMIA  HEMATOLOGY HISTORY  # 1990- left breast cancer- s/p lumpectomy; chemo- RT; no pills [ER- NEG]; [facility].   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  # ANEMIA[Hb; MCV-platelets- WBC; Iron sat; ferritin;  GFR- CT/US; EGD- none/colonoscopy-2023-Feb- NEG-     Latest Reference Range & Units 04/03/22 12:05  Kappa free light chain 3.3 - 19.4 mg/L 15.0  Lambda free light chains 5.7 - 26.3 mg/L 6.6  Kappa, lambda light chain ratio 0.26 - 1.65  2.27 (H)  (H): Data is abnormally high   Latest Reference Range & Units 04/03/22 12:05  LDH 98 - 192 U/L 118  Iron 28 - 170 ug/dL 59  UIBC ug/dL 914  TIBC 782 - 956 ug/dL 213  Saturation Ratios 10.4 - 31.8 % 15  Ferritin 11 - 307 ng/mL 23  Folate >5.9 ng/mL 13.9  Vitamin B12 180 - 914 pg/mL 594   HISTORY OF PRESENTING ILLNESS: Alone.  Ambulating independently.   Monica Baxter 77 y.o.  female pleasant patient with mild anemia noted to have incidental lung nodules is here to follow-up/and review results of the CT scan/and PEt scan.  Patient denies any new symptoms.  Denies any headaches. SHe had no worsening cough or shortness of breath.  Review of Systems  Constitutional:  Positive for malaise/fatigue. Negative for chills, diaphoresis, fever and weight loss.  HENT:  Negative for nosebleeds and sore throat.   Eyes:  Negative for double vision.  Respiratory:  Negative for hemoptysis, sputum production, shortness of breath and wheezing.   Cardiovascular:  Negative for chest pain, palpitations, orthopnea and leg swelling.  Gastrointestinal:  Negative for abdominal pain, blood in stool, constipation, diarrhea, heartburn, melena, nausea and vomiting.   Genitourinary:  Negative for dysuria, frequency and urgency.  Musculoskeletal:  Negative for back pain and joint pain.  Skin: Negative.  Negative for itching and rash.  Neurological:  Negative for dizziness, tingling, focal weakness, weakness and headaches.  Endo/Heme/Allergies:  Does not bruise/bleed easily.  Psychiatric/Behavioral:  Negative for depression. The patient is not nervous/anxious and does not have insomnia.     MEDICAL HISTORY:  Past Medical History:  Diagnosis Date   Anemia    Breast cancer (HCC) 1990   left   Cancer (HCC)    breast    Hypertension    Personal history of chemotherapy    Personal history of radiation therapy     SURGICAL HISTORY: Past Surgical History:  Procedure Laterality Date   APPENDECTOMY  2015   BREAST BIOPSY     BREAST EXCISIONAL BIOPSY     BREAST LUMPECTOMY Left 1990   CHOLECYSTECTOMY  1995   COLONOSCOPY  2017   HERNIA REPAIR  2016   umbilical    LOBECTOMY Bilateral 2002,2015    SOCIAL HISTORY: Social History   Socioeconomic History   Marital status: Married    Spouse name: Not on file   Number of children: Not on file   Years of education: Not on file   Highest education level: Not on file  Occupational History   Not on file  Tobacco Use   Smoking status: Former    Current packs/day: 0.00    Types: Cigarettes    Quit date: 1982    Years since  quitting: 42.9   Smokeless tobacco: Never  Substance and Sexual Activity   Alcohol use: Yes    Alcohol/week: 2.0 standard drinks of alcohol    Types: 2 Glasses of wine per week   Drug use: Never   Sexual activity: Not on file  Other Topics Concern   Not on file  Social History Narrative   Not on file   Social Determinants of Health   Financial Resource Strain: Low Risk  (01/01/2023)   Received from [facility]   Overall Financial Resource Strain (CARDIA)    Difficulty of Paying Living Expenses: Not hard at all  Food Insecurity: No Food Insecurity  (01/01/2023)   Received from [facility]   Hunger Vital Sign    Worried About Running Out of Food in the Last Year: Never true    Ran Out of Food in the Last Year: Never true  Transportation Needs: No Transportation Needs (01/01/2023)   Received from [facility] - Transportation    In the past 12 months, has lack of transportation kept you from medical appointments or from getting medications?: No    Lack of Transportation (Non-Medical): No  Physical Activity: Not on file  Stress: Not on file  Social Connections: Not on file  Intimate Partner Violence: Not on file    FAMILY HISTORY: Family History  Adopted: Yes  Family history unknown: Yes    ALLERGIES:  is allergic to penicillins, codeine, and hydromorphone hcl.  MEDICATIONS:  Current Outpatient Medications  Medication Sig Dispense Refill   amLODipine (NORVASC) 10 MG tablet      Cholecalciferol (VITAMIN D3) 1000 units CAPS Take 1 tablet by mouth daily.     cyanocobalamin (VITAMIN B12) 1000 MCG tablet Take 1,000 mcg by mouth daily.     Fluticasone-Umeclidin-Vilant (TRELEGY ELLIPTA) 100-62.5-25 MCG/ACT AEPB Inhale 1 puff into the lungs daily.     gabapentin (NEURONTIN) 600 MG tablet Take by mouth.     omeprazole (PRILOSEC) 20 MG capsule Take by mouth.     raloxifene (EVISTA) 60 MG tablet      solifenacin (VESICARE) 5 MG tablet      No current facility-administered medications for this visit.     Marland Kitchen  PHYSICAL EXAMINATION:   Vitals:   03/26/23 1347  BP: 131/75  Pulse: 90  Temp: 98.6 F (37 C)  SpO2: 98%     Filed Weights   03/26/23 1347  Weight: 153 lb (69.4 kg)      Physical Exam Vitals and nursing note reviewed.  HENT:     Head: Normocephalic and atraumatic.     Mouth/Throat:     Pharynx: Oropharynx is clear.  Eyes:     Extraocular Movements: Extraocular movements intact.     Pupils: Pupils are equal, round, and reactive to light.  Cardiovascular:     Rate  and Rhythm: Normal rate and regular rhythm.  Pulmonary:     Comments: Decreased breath sounds bilaterally.  Abdominal:     Palpations: Abdomen is soft.  Musculoskeletal:        General: Normal range of motion.     Cervical back: Normal range of motion.  Skin:    General: Skin is warm.  Neurological:     General: No focal deficit present.     Mental Status: She is alert and oriented to person, place, and time.  Psychiatric:        Behavior: Behavior normal.  Judgment: Judgment normal.      LABORATORY DATA:  I have reviewed the data as listed Lab Results  Component Value Date   WBC 5.0 03/06/2023   HGB 10.4 (L) 03/06/2023   HCT 32.8 (L) 03/06/2023   MCV 87.9 03/06/2023   PLT 379 03/06/2023   Recent Labs    11/13/22 1011 03/06/23 1008  NA 139 137  K 3.7 4.5  CL 101 103  CO2 28 27  GLUCOSE 224* 93  BUN 12 14  CREATININE 0.90 0.58  CALCIUM 8.6* 9.4  GFRNONAA >60 >60     NM PET Image Restage (PS) Skull Base to Thigh (F-18 FDG)  Result Date: 03/21/2023 CLINICAL DATA:  Subsequent treatment strategy for lung nodule, breast cancer. EXAM: NUCLEAR MEDICINE PET SKULL BASE TO THIGH TECHNIQUE: 8.5 mCi F-18 FDG was injected intravenously. Full-ring PET imaging was performed from the skull base to thigh after the radiotracer. CT data was obtained and used for attenuation correction and anatomic localization. Fasting blood glucose: 90 mg/dl COMPARISON:  CT chest 40/01/2724, PET 08/08/2022. FINDINGS: Mediastinal blood pool activity: SUV max 2.4 Liver activity: SUV max NA NECK: No abnormal hypermetabolism. Incidental CT findings: None. CHEST: Index 5 mm right middle lobe nodule (6/40), SUV max 1.6, decreased in size from 7 mm on 02/27/2023. Slight improvement in peribronchovascular nodular consolidation seen predominantly in the right middle lobe and lingula. There are scattered hematogenously distributed well-circumscribed pulmonary nodules with index 8 mm left lower lobe nodule  (6/49), SUV max 2.0, minimally increased in size from 7 mm on 08/08/2022. Minimally hypermetabolic prevascular lymph node measures 9 mm (6/46), SUV max 2.7. No additional abnormal hypermetabolism. Incidental CT findings: Atherosclerotic calcification of the aorta, aortic valve and coronary arteries. Heart is at the upper limits of normal in size to mildly enlarged. No pericardial or pleural effusion. Right upper lobectomy. ABDOMEN/PELVIS: No abnormal hypermetabolism. Incidental CT findings: Cholecystectomy. Small hiatal hernia. Bilateral inguinal hernias contain fat. SKELETON: No abnormal hypermetabolism. Incidental CT findings: Degenerative changes in the spine. IMPRESSION: 1. Improving right middle lobe and lingular peribronchovascular nodularity, indicative of an atypical infectious process. 2. Additional scattered borderline hypermetabolic well-circumscribed hematogenously distributed pulmonary nodules are worrisome for metastatic disease. Recommend attention on follow-up. 3. Aortic atherosclerosis (ICD10-I70.0). Coronary artery calcification. Electronically Signed   By: Leanna Battles M.D.   On: 03/21/2023 10:18   CT CHEST W CONTRAST  Result Date: 03/05/2023 CLINICAL DATA:  COPD. History of left breast cancer with prior radiation and chemotherapy. Lung nodules. * Tracking Code: BO * EXAM: CT CHEST WITH CONTRAST TECHNIQUE: Multidetector CT imaging of the chest was performed during intravenous contrast administration. RADIATION DOSE REDUCTION: This exam was performed according to the departmental dose-optimization program which includes automated exposure control, adjustment of the mA and/or kV according to patient size and/or use of iterative reconstruction technique. CONTRAST:  75mL OMNIPAQUE IOHEXOL 300 MG/ML  SOLN COMPARISON:  PET-CT 08/08/2022.  Chest CT 07/13/2022 FINDINGS: Cardiovascular: Heart is nonenlarged. No pericardial effusion. Coronary artery calcifications are seen. The thoracic aorta has a  normal course and caliber with scattered atherosclerotic calcified plaque. There is a bovine type aortic arch. Mediastinum/Nodes: Small hiatal hernia. Preserved thyroid gland. No specific abnormal lymph node enlargement identified in the axillary regions, hilum or mediastinum. Lungs/Pleura: Increasing lower anterior lung scarring and fibrotic changes identified in the middle lobe and lingula. No separate consolidation, pneumothorax or effusion. Once again there are several nodules identified. These are appear to be increasing in size and number slightly from the  previous CT scan of March 2024. For example the 7 mm nodule seen left lower lobe, today on series 3, image 68 measures 9 mm. The right upper lobe nodule which previously measured 3 mm, today is much larger and spiculated with some ground-glass on series 3, image 32 measuring 7 mm. This has also significantly increased in size from the PET-CT scan of April 2024. Right lower lobe nodule measuring 5 mm on series 3, image 71 is similar to the previous which had measured 5 mm as well. There are some other nodules which are similar. Upper Abdomen: Adrenal glands are preserved in the upper abdomen. Musculoskeletal: Curvature and degenerative changes along the spine. IMPRESSION: Multiple bilateral lung nodules. There are are few nodule which has slightly increased in size such as left lower lobe nodule seen previously now measuring 9 mm as opposed to previous measurement of 7 mm. There is 1 nodule in particular in the right upper lobe which is significantly increased in size and has a more ground-glass and spiculated now measuring 7 mm and previously 3 mm. This has more worrisome for potential neoplasm. Recommend further workup. Repeat PET-CT may be of some benefit. The findings will be called to the ordering service by the Radiology physician assistant team. Aortic Atherosclerosis (ICD10-I70.0). Electronically Signed   By: Karen Kays M.D.   On: 03/05/2023 16:20     ASSESSMENT & PLAN:   Multiple lung nodules # Incidental CT-March 2024 [Dr.Fleming-COPD] Subcentimeter lung nodules [chronic but slowly growing-compared to 2020 CT scan ] - APRIL 2024-PET scan negative for any uptake-which is reassuring.  However increase in size of Central left lower lobe 0.7 cm solid pulmonary nodule [in 2020 being 3 mm]; other stable lung nodules bilaterally. - NOV 11th, 2024- Multiple bilateral lung nodules. There are are few nodule which has slightly increased in size such as left lower lobe nodule seen previously now measuring 9 mm as opposed to previous measurement of 7 mm. There is 1 nodule in particular in the right upper lobe which is significantly increased in size and has a more ground-glass and spiculated now measuring 7 mm and previously 3 mm. This has more worrisome for potential neoplasm. 03/21/2023- PET scan: Improving right middle lobe and lingular peri-bronchovascular nodularity, indicative of an atypical infectious process.  Additional scattered borderline hypermetabolic well-circumscribed hematogenously distributed pulmonary nodules are worrisome for metastatic disease. Discussed with Dr.A. Recommend further workup/ follow up with Dr.Fleming.   # Given the overall small lung nodules I doubt if patient would get a biopsy.  Patient not too keen on open biopsy which I agree.  Again await evaluation with Dr. Meredeth Ide tomorrow.  Will also discuss at tumor conference.  Discussed with Rolly Salter.  # Mild Anemia [Hb 11.5-12; since 2021] normal MCV- ? Symptomatic- mild fatigue; DEC 2023-iron saturation 15%: Ferritin 26.  B12/folic acid/M protein negative; kappa lambda light chain ratio slightly abnormal at 2.6.   Today hemoglobin is 10.5 ; MCV normal. Ok to come off the iron pill.   Hold off any further evaluation including bone marrow at this time.  # Hx of lumpectomy- mastectomy + ALND [1990]- No evidence of any Lymphedema/IV access;  Low clinical concerns for any recurrent  malignancy.  However monitor closely with upper lung nodules.-Await above workup.  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 Low clinical concerns for any recurrent malignancy-again await above workup.  # Right  upper extremity dysplastic nevi-recommend follow-up with dermatology recommendations regarding excision.  # DISPOSITION: # follow up TBD- --Dr.B  # I reviewed the blood work- with the patient in detail; also reviewed the imaging independently [as summarized above]; and with the patient in detail.     All questions were answered. The patient knows to call the clinic with any problems, questions or concerns.  Earna Coder, MD 03/26/2023 2:32 PM

## 2023-03-29 ENCOUNTER — Other Ambulatory Visit: Payer: Medicare Other

## 2023-03-30 ENCOUNTER — Telehealth: Payer: Self-pay | Admitting: Internal Medicine

## 2023-03-30 NOTE — Telephone Encounter (Signed)
Spoke to [employer] re: discussion at tumor conference. After discussing the pro/cons- it was decide to repeat/CT scan in 4 months/defer FU with Dr.A to discuss re: bronch.   Follow up with [place] TBD-  GB

## 2023-04-16 ENCOUNTER — Encounter: Payer: Self-pay | Admitting: Internal Medicine

## 2023-05-01 ENCOUNTER — Telehealth: Payer: Self-pay | Admitting: Internal Medicine

## 2023-05-01 NOTE — Telephone Encounter (Signed)
 Patient called and said she noticed a very large and hard lump on her breast bone. It hurts when she coughs and when she touches it. She is concerned and wants to come in to see you. Please Advise

## 2023-05-01 NOTE — Telephone Encounter (Signed)
 Call returned to patient and advised to please contact her PCP for evaluation of area and let us know if we need to do anything at that time. She agrees to contact PCP.

## 2023-05-01 NOTE — Telephone Encounter (Signed)
 Call returned to patient on home and mobile phone and I got voice mail on both phones. I have left a message to return my call for more information regarding her problem

## 2023-05-01 NOTE — Telephone Encounter (Signed)
 I spoke with patient who states that she has a lump for the past week that is the size of a quarter that just popped up it has not changed in size has not changed in the past week, but the pain has gotten worse.She states that she  has not injured herself to cause it, it is not red or warm to touch.  She reports that it hurts when she coughs or touches it. Please advise

## 2023-05-08 ENCOUNTER — Other Ambulatory Visit: Payer: Self-pay | Admitting: Internal Medicine

## 2023-05-08 DIAGNOSIS — Z1231 Encounter for screening mammogram for malignant neoplasm of breast: Secondary | ICD-10-CM

## 2023-06-20 ENCOUNTER — Ambulatory Visit
Admission: RE | Admit: 2023-06-20 | Discharge: 2023-06-20 | Disposition: A | Discharge status: Home / Self Care | Payer: Medicare Other | Source: Ambulatory Visit | Attending: Internal Medicine | Admitting: Internal Medicine

## 2023-06-20 DIAGNOSIS — Z1231 Encounter for screening mammogram for malignant neoplasm of breast: Secondary | ICD-10-CM | POA: Insufficient documentation

## 2023-07-04 ENCOUNTER — Other Ambulatory Visit: Payer: Self-pay | Admitting: Pulmonary Disease

## 2023-07-04 DIAGNOSIS — R918 Other nonspecific abnormal finding of lung field: Secondary | ICD-10-CM

## 2023-07-27 ENCOUNTER — Ambulatory Visit
Admission: RE | Admit: 2023-07-27 | Discharge: 2023-07-27 | Disposition: A | Discharge status: Home / Self Care | Source: Ambulatory Visit | Attending: Pulmonary Disease | Admitting: Pulmonary Disease

## 2023-07-27 DIAGNOSIS — R918 Other nonspecific abnormal finding of lung field: Secondary | ICD-10-CM | POA: Insufficient documentation

## 2023-07-27 MED ORDER — IOHEXOL 300 MG/ML  SOLN
75.0000 mL | Freq: Once | INTRAMUSCULAR | Status: AC | PRN
  Administered 2023-07-27: 75 mL via INTRAVENOUS

## 2023-08-21 ENCOUNTER — Inpatient Hospital Stay: Attending: Internal Medicine | Admitting: Internal Medicine

## 2023-08-21 ENCOUNTER — Encounter: Payer: Self-pay | Admitting: Internal Medicine

## 2023-08-21 VITALS — BP 126/62 | HR 86 | Temp 98.8°F | Resp 18 | Ht 66.0 in | Wt 151.1 lb

## 2023-08-21 DIAGNOSIS — R918 Other nonspecific abnormal finding of lung field: Secondary | ICD-10-CM

## 2023-08-21 DIAGNOSIS — D649 Anemia, unspecified: Secondary | ICD-10-CM | POA: Insufficient documentation

## 2023-08-21 DIAGNOSIS — Z87891 Personal history of nicotine dependence: Secondary | ICD-10-CM | POA: Insufficient documentation

## 2023-08-21 DIAGNOSIS — Z853 Personal history of malignant neoplasm of breast: Secondary | ICD-10-CM | POA: Diagnosis present

## 2023-08-21 DIAGNOSIS — Z85118 Personal history of other malignant neoplasm of bronchus and lung: Secondary | ICD-10-CM | POA: Diagnosis present

## 2023-08-21 DIAGNOSIS — Z9221 Personal history of antineoplastic chemotherapy: Secondary | ICD-10-CM | POA: Insufficient documentation

## 2023-08-21 DIAGNOSIS — Z923 Personal history of irradiation: Secondary | ICD-10-CM | POA: Diagnosis not present

## 2023-08-21 NOTE — Progress Notes (Signed)
 Pt having worsening sob.  C/o that 5 months ago she had metastatic lung cancer to not having it at all. She would like to go over her PET from 03/07/24 and CT chest from 07/27/23.

## 2023-08-21 NOTE — Assessment & Plan Note (Addendum)
#   Incidental CT-March 2024 [Dr.Fleming-COPD] Subcentimeter lung nodules [chronic but slowly growing-compared to 2020 CT scan ] - APRIL 2024-PET scan negative for any uptake-which is reassuring.  However increase in size of Central left lower lobe 0.7 cm solid pulmonary nodule [in 2020 being 3 mm]; other stable lung nodules bilaterally. - NOV 11th, 2024- Multiple bilateral lung nodules. There are are few nodule which has slightly increased in size such as left lower lobe nodule seen previously now measuring 9 mm as opposed to previous measurement of 7 mm. There is 1 nodule in particular in the right upper lobe which is significantly increased in size and has a more ground-glass and spiculated now measuring 7 mm and previously 3 mm. This has more worrisome for potential neoplasm. 03/21/2023- PET scan: Improving right middle lobe and lingular peri-bronchovascular nodularity, indicative of an atypical infectious process.  Additional scattered borderline hypermetabolic well-circumscribed hematogenously distributed pulmonary nodules are worrisome for metastatic disease. CT scan MARCH 2025-overall stable lung nodules.    # Discussed with patient at length-regarding overall stability of the lung nodules without any significant progression.  Suspect benign/slow-growing malignancy like carcinoid.  However unable to diagnose without a biopsy.  The nodules are small to consider a biopsy.  Would not recommend open lung biopsy.  Would recommend continue follow-up/with [facility].   # Mild Anemia [Hb 11.5-12; since 2021] normal MCV- ? Symptomatic- mild fatigue; DEC 2023-iron saturation 15%: Ferritin 26.  B12/folic acid /M protein negative; kappa lambda light chain ratio slightly abnormal at 2.6.   Today hemoglobin is 10.5 ; MCV normal. Ok to come off the iron pill.   Hold off any further evaluation including bone marrow at this time.  # Hx of lumpectomy- mastectomy + ALND [1990]- No evidence of any Lymphedema/IV access;   Low clinical concerns for any recurrent malignancy.  However monitor closely with upper lung nodules.- stable.  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 Low clinical concerns for any recurrent malignancy- stable. See scan above.    CT scan with Dr.Fleming # DISPOSITION: # follow up in early NOV 2025- MD: labs- cbc/cmp; iron studies.ferritin; [facility]- Dr.B  # I reviewed the blood work- with the patient in detail; also reviewed the imaging independently [as summarized above]; and with the patient in detail.

## 2023-08-21 NOTE — Progress Notes (Signed)
 [facility] CONSULT NOTE  Patient Care Team: Yehuda Helms, MD as PCP - General (Internal Medicine) Gwyn Leos, MD as Consulting Physician (Oncology) Drake Gens, RN as Oncology Nurse Navigator  CHIEF COMPLAINTS/PURPOSE OF CONSULTATION: ANEMIA  HEMATOLOGY HISTORY  # 1990- left breast cancer- s/p lumpectomy; chemo- RT; no pills [ER- NEG]; Monica Baxter.   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  # ANEMIA[Hb; MCV-platelets- WBC; Iron sat; ferritin;  GFR- CT/US ; EGD- none/colonoscopy-2023-Feb- NEG-     Latest Reference Range & Units 04/03/22 12:05  Kappa free light chain 3.3 - 19.4 mg/L 15.0  Lambda free light chains 5.7 - 26.3 mg/L 6.6  Kappa, lambda light chain ratio 0.26 - 1.65  2.27 (H)  (H): Data is abnormally high   Latest Reference Range & Units 04/03/22 12:05  LDH 98 - 192 U/L 118  Iron 28 - 170 ug/dL 59  UIBC ug/dL 098  TIBC 119 - 147 ug/dL 829  Saturation Ratios 10.4 - 31.8 % 15  Ferritin 11 - 307 ng/mL 23  Folate >5.9 ng/mL 13.9  Vitamin B12 180 - 914 pg/mL 594   HISTORY OF PRESENTING ILLNESS: Alone.  Ambulating independently.   Monica Baxter 78 y.o.  female pleasant patient with mild anemia noted to have incidental lung nodules is here to follow-up/and review results of the CT scan/and PEt scan.  Patient has chronic mild shortness of breath chronic mild cough not any worse.  She is concerned about the recent imaging done with pulmonary.   Denies any bone pain or joint pains.  Denies any worsening back pain.  Review of Systems  Constitutional:  Positive for malaise/fatigue. Negative for chills, diaphoresis, fever and weight loss.  HENT:  Negative for nosebleeds and sore throat.   Eyes:  Negative for double vision.  Respiratory:  Negative for hemoptysis, sputum production, shortness of breath and wheezing.   Cardiovascular:  Negative for chest pain, palpitations,  orthopnea and leg swelling.  Gastrointestinal:  Negative for abdominal pain, blood in stool, constipation, diarrhea, heartburn, melena, nausea and vomiting.  Genitourinary:  Negative for dysuria, frequency and urgency.  Musculoskeletal:  Negative for back pain and joint pain.  Skin: Negative.  Negative for itching and rash.  Neurological:  Negative for dizziness, tingling, focal weakness, weakness and headaches.  Endo/Heme/Allergies:  Does not bruise/bleed easily.  Psychiatric/Behavioral:  Negative for depression. The patient is not nervous/anxious and does not have insomnia.     MEDICAL HISTORY:  Past Medical History:  Diagnosis Date   Anemia    Breast cancer (HCC) 1990   left   Cancer (HCC)    breast    Hypertension    Personal history of chemotherapy    Personal history of radiation therapy     SURGICAL HISTORY: Past Surgical History:  Procedure Laterality Date   APPENDECTOMY  2015   BREAST BIOPSY     BREAST EXCISIONAL BIOPSY     BREAST LUMPECTOMY Left 1990   CHOLECYSTECTOMY  1995   COLONOSCOPY  2017   HERNIA REPAIR  2016   umbilical    LOBECTOMY Bilateral 2002,2015    SOCIAL HISTORY: Social History   Socioeconomic History   Marital status: Married    Spouse name: Not on file   Number of children: Not on file   Years of education: Not on file   Highest education level: Not on file  Occupational History   Not on file  Tobacco  Use   Smoking status: Former    Current packs/day: 0.00    Types: Cigarettes    Quit date: 1982    Years since quitting: 43.3   Smokeless tobacco: Never  Substance and Sexual Activity   Alcohol use: Yes    Alcohol/week: 2.0 standard drinks of alcohol    Types: 2 Glasses of wine per week   Drug use: Never   Sexual activity: Not on file  Other Topics Concern   Not on file  Social History Narrative   Not on file   Social Drivers of Health   Financial Resource Strain: Low Risk  (01/01/2023)   [facility]   Overall Financial Resource Strain (CARDIA)    Difficulty of Paying Living Expenses: Not hard at all  Food Insecurity: No Food Insecurity (01/01/2023)   Received from [facility]   Hunger Vital Sign    Worried About Running Out of Food in the Last Year: Never true    Ran Out of Food in the Last Year: Never true  Transportation Needs: No Transportation Needs (01/01/2023)   Received from [facility] - [facility]    In the past 12 months, has lack of transportation kept you from medical appointments or from getting medications?: No    Lack of Transportation (Non-Medical): No  Physical Activity: Not on file  Stress: Not on file  Social Connections: Not on file  Intimate Partner Violence: Not on file    FAMILY HISTORY: Family History  Adopted: Yes  Family history unknown: Yes    ALLERGIES:  is allergic to penicillins, codeine, and hydromorphone hcl.  MEDICATIONS:  Current Outpatient Medications  Medication Sig Dispense Refill   albuterol (VENTOLIN HFA) 108 (90 Base) MCG/ACT inhaler Inhale 2 puffs into the lungs daily.     amLODipine (NORVASC) 10 MG tablet      Fluticasone-Umeclidin-Vilant (TRELEGY ELLIPTA) 100-62.5-25 MCG/ACT AEPB Inhale 1 puff into the lungs daily.     gabapentin (NEURONTIN) 600 MG tablet Take by mouth.     omeprazole (PRILOSEC) 20 MG capsule Take by mouth.     raloxifene (EVISTA) 60 MG tablet      solifenacin (VESICARE) 5 MG tablet      Cholecalciferol (VITAMIN D3) 1000 units CAPS Take 1 tablet by mouth daily. (Patient not taking: Reported on 08/21/2023)     cyanocobalamin  (VITAMIN B12) 1000 MCG tablet Take 1,000 mcg by mouth daily. (Patient not taking: Reported on 08/21/2023)     No current facility-administered medications for this visit.     Aaron Aas  PHYSICAL EXAMINATION:   Vitals:   08/21/23 1436  BP: 126/62  Pulse: 86  Resp: 18  Temp: 98.8 F (37.1 C)  SpO2: 98%     Filed Weights   08/21/23  1436  Weight: 151 lb 1.6 oz (68.5 kg)      Physical Exam Vitals and nursing note reviewed.  HENT:     Head: Normocephalic and atraumatic.     Mouth/Throat:     Pharynx: Oropharynx is clear.  Eyes:     Extraocular Movements: Extraocular movements intact.     Pupils: Pupils are equal, round, and reactive to light.  Cardiovascular:     Rate and Rhythm: Normal rate and regular rhythm.  Pulmonary:     Comments: Decreased breath sounds bilaterally.  Abdominal:     Palpations: Abdomen is soft.  Musculoskeletal:        General: Normal range of  motion.     Cervical back: Normal range of motion.  Skin:    General: Skin is warm.  Neurological:     General: No focal deficit present.     Mental Status: She is alert and oriented to person, place, and time.  Psychiatric:        Behavior: Behavior normal.        Judgment: Judgment normal.      LABORATORY DATA:  I have reviewed the data as listed Lab Results  Component Value Date   WBC 5.0 03/06/2023   HGB 10.4 (L) 03/06/2023   HCT 32.8 (L) 03/06/2023   MCV 87.9 03/06/2023   PLT 379 03/06/2023   Recent Labs    11/13/22 1011 03/06/23 1008  NA 139 137  K 3.7 4.5  CL 101 103  CO2 28 27  GLUCOSE 224* 93  BUN 12 14  CREATININE 0.90 0.58  CALCIUM 8.6* 9.4  GFRNONAA >60 >60     CT CHEST W CONTRAST Result Date: 08/01/2023 CLINICAL DATA:  Chronic cough, prior CT March 2024 demonstrates several subcentimeter pulmonary lung nodules EXAM: CT CHEST WITH CONTRAST TECHNIQUE: Multidetector CT imaging of the chest was performed during intravenous contrast administration. RADIATION DOSE REDUCTION: This exam was performed according to the departmental dose-optimization program which includes automated exposure control, adjustment of the mA and/or kV according to patient size and/or use of iterative reconstruction technique. CONTRAST:  75mL OMNIPAQUE  IOHEXOL  300 MG/ML  SOLN COMPARISON:  PET-CT March 08, 2023 and CT chest February 27, 2023  FINDINGS: Cardiovascular: No significant vascular findings. Normal heart size. No pericardial effusion. Diffuse coronary artery calcifications. No pericardial effusions Mediastinum/Nodes: No enlarged mediastinal, hilar, or axillary lymph nodes. Thyroid gland, trachea, and esophagus demonstrate no significant findings. Lungs/Pleura: Stable 4 mm irregular nodule in the anterior segment of the right upper lobe image 39 stable 2 mm nodule in the medial margin of the left upper lobe image 42 stable 3 mm nodule adjacent lateral to the aortic arch on image 61 Stable 3 mm nodule in the left upper lobe image 49 Stable 2 mm subpleural nodule superior segment left lower lobe image 53 Stable 9 mm nodule in the superior segment of the left lower lobe adjacent to the descending thoracic aorta image 65. Stable 5 mm nodule of the right lower lobe image 70. Stable 6 mm right lower lobe nodule image 100 multiple other smaller sub 5 mm pulmonary nodules throughout both lungs are also stable since prior examination. Lung-RADS 2, benign appearance or behavior. Continue annual screening with low-dose chest CT without contrast in 12 months. Upper Abdomen: No acute abnormality.  Small hiatal hernia Musculoskeletal: No chest wall abnormality. No acute or significant osseous findings. IMPRESSION: *Multiple stable pulmonary nodules as described above. Lung-RADS 2, benign appearance or behavior. Continue annual screening with low-dose chest CT without contrast in 12 months. *Small hiatal hernia. *Diffuse coronary artery calcifications. Electronically Signed   By: Fredrich Jefferson M.D.   On: 08/01/2023 15:06    ASSESSMENT & PLAN:   Multiple lung nodules # Incidental CT-March 2024 [Dr.Fleming-COPD] Subcentimeter lung nodules [chronic but slowly growing-compared to 2020 CT scan ] - APRIL 2024-PET scan negative for any uptake-which is reassuring.  However increase in size of Central left lower lobe 0.7 cm solid pulmonary nodule [in 2020 being 3  mm]; other stable lung nodules bilaterally. - NOV 11th, 2024- Multiple bilateral lung nodules. There are are few nodule which has slightly increased in size such as left lower lobe  nodule seen previously now measuring 9 mm as opposed to previous measurement of 7 mm. There is 1 nodule in particular in the right upper lobe which is significantly increased in size and has a more ground-glass and spiculated now measuring 7 mm and previously 3 mm. This has more worrisome for potential neoplasm. 03/21/2023- PET scan: Improving right middle lobe and lingular peri-bronchovascular nodularity, indicative of an atypical infectious process.  Additional scattered borderline hypermetabolic well-circumscribed hematogenously distributed pulmonary nodules are worrisome for metastatic disease. CT scan MARCH 2025-overall stable lung nodules.    # Discussed with patient at length-regarding overall stability of the lung nodules without any significant progression.  Suspect benign/slow-growing malignancy like carcinoid.  However unable to diagnose without a biopsy.  The nodules are small to consider a biopsy.  Would not recommend open lung biopsy.  Would recommend continue follow-up/with [facility].   # Mild Anemia [Hb 11.5-12; since 2021] normal MCV- ? Symptomatic- mild fatigue; DEC 2023-iron saturation 15%: Ferritin 26.  B12/folic acid /M protein negative; kappa lambda light chain ratio slightly abnormal at 2.6.   Today hemoglobin is 10.5 ; MCV normal. Ok to come off the iron pill.   Hold off any further evaluation including bone marrow at this time.  # Hx of lumpectomy- mastectomy + ALND [1990]- No evidence of any Lymphedema/IV access;  Low clinical concerns for any recurrent malignancy.  However monitor closely with upper lung nodules.- stable.  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 Low clinical concerns for any recurrent  malignancy- stable. See scan above.    CT scan with Dr.Fleming # DISPOSITION: # follow up in early NOV 2025- MD: labs- cbc/cmp; iron studies.ferritin; [facility]- Dr.B  # I reviewed the blood work- with the patient in detail; also reviewed the imaging independently [as summarized above]; and with the patient in detail.      All questions were answered. The patient knows to call the clinic with any problems, questions or concerns.  Gwyn Leos, MD 08/24/2023 12:02 PM

## 2024-02-19 ENCOUNTER — Other Ambulatory Visit: Payer: Self-pay | Admitting: Specialist

## 2024-02-19 DIAGNOSIS — R918 Other nonspecific abnormal finding of lung field: Secondary | ICD-10-CM

## 2024-02-19 DIAGNOSIS — R0609 Other forms of dyspnea: Secondary | ICD-10-CM

## 2024-02-26 NOTE — Progress Notes (Signed)
 Follow-up, COPD, Shortness of Breath, and Lung Nodules   History of Present Illness: Monica Baxter is a 78 y.o. female who presents to clinic for routine recheck. In moaning. Lost her husband in three weeks good. Severe copd, not oxygen, no prednisone. No wheezing, has to pace self. No cardiac sxs. Edema or leg pain. No fever or chills. Following known pulmonary nodules. No weight loss.    Current Medications:  Current Outpatient Medications  Medication Sig Dispense Refill  . albuterol MDI, PROVENTIL, VENTOLIN, PROAIR, HFA 90 mcg/actuation inhaler Inhale 2 inhalations into the lungs every 6 (six) hours as needed 17 each 6  . amLODIPine (NORVASC) 10 MG tablet Take 1 tablet (10 mg total) by mouth every evening 90 tablet 1  . benzonatate (TESSALON) 200 MG capsule Take 1 capsule (200 mg total) by mouth 3 (three) times daily as needed for Cough 30 capsule 5  . escitalopram oxalate (LEXAPRO) 10 MG tablet TAKE 1 TABLET BY MOUTH DAILY 30 tablet 2  . fenofibrate 160 MG tablet Take 1 tablet (160 mg total) by mouth once daily 90 tablet 1  . fluticasone-umeclidinium-vilanterol (TRELEGY ELLIPTA) 100-62.5-25 mcg inhaler Inhale 1 Puff into the lungs once daily 180 each 3  . gabapentin (NEURONTIN) 600 MG tablet TAKE 1 TABLET BY MOUTH TWICE DAILY 180 tablet 1  . imipramine (TOFRANIL) 10 MG tablet TAKE ONE TABLET AT BEDTIME 90 tablet 1  . metoprolol SUCCinate (TOPROL-XL) 50 MG XL tablet TAKE 1 TABLET BY MOUTH EVERY MORNING 90 tablet 1  . montelukast (SINGULAIR) 10 mg tablet Take 1 tablet (10 mg total) by mouth at bedtime 90 tablet 1  . omeprazole (PRILOSEC) 40 MG DR capsule Take 1 capsule (40 mg total) by mouth 2 (two) times daily before meals 180 capsule 1  . raloxifene (EVISTA) 60 mg tablet Take 1 tablet (60 mg total) by mouth once daily 90 tablet 1  . solifenacin (VESICARE) 5 MG tablet Take 1 tablet (5 mg total) by mouth once daily 90 tablet 1  . fluticasone propionate (FLONASE) 50 mcg/actuation nasal  spray Place 2 sprays into both nostrils once daily (Patient not taking: Reported on 02/26/2024) 16 g 2  . triamcinolone 0.1 % cream  (Patient not taking: Reported on 02/26/2024)     No current facility-administered medications for this visit.    Problem List:  Patient Active Problem List  Diagnosis  . Cataract  . Atypical chest pain  . GERD without esophagitis  . Essential hypertension  . Allergic rhinitis  . Anemia  . Chronic obstructive pulmonary disease (CMS/HHS-HCC)  . Insomnia  . Pure hypercholesterolemia  . Diverticulitis large intestine w/o perforation or abscess w/o bleeding  . Left lower quadrant abdominal tenderness without rebound tenderness  . Personal history of colonic polyps  . Right lower quadrant pain  . Chronic constipation  . Right inguinal hernia  . B12 deficiency  . Statin intolerance  . Acute constipation  . Diverticulosis of colon  . Vitamin D deficiency  . Prediabetes    History: Past Medical History:  Diagnosis Date  . Anemia 2024   Mild  . Bronchitis, chronic (CMS/HHS-HCC)   . COPD (chronic obstructive pulmonary disease) (CMS/HHS-HCC) Treating  . GERD (gastroesophageal reflux disease) 1990  . History of cancer Breast, lung  . Hypertension Treated  . Lung cancer (CMS/HHS-HCC)     Past Surgical History:  Procedure Laterality Date  . VITREOUS RETINAL SURGERY Left 04/24/2001   Laser to correct a Retinal Tear  . COLONOSCOPY  06/28/2021  Non bleeding internal hemorrhoids, severe diverticulosis, normal colon/ no repeat due to age/TKT    Family History  Adopted: Yes    Social History   Socioeconomic History  . Marital status: Married  Tobacco Use  . Smoking status: Former    Current packs/day: 0.00    Average packs/day: 2.0 packs/day for 15.0 years (30.0 ttl pk-yrs)    Types: Cigarettes    Start date: 11/08/1964    Quit date: 11/09/1979    Years since quitting: 44.3  . Smokeless tobacco: Never  Vaping Use  . Vaping status: Never  Used  Substance and Sexual Activity  . Alcohol use: Yes    Alcohol/week: 3.0 standard drinks of alcohol    Types: 3 Glasses of wine per week    Comment: 2 or 3 glasses wine/week  . Drug use: No  . Sexual activity: Not Currently    Partners: Male    Birth control/protection: Post-menopausal   Social Drivers of Health   Financial Resource Strain: Low Risk  (01/01/2023)   Overall Financial Resource Strain (CARDIA)   . Difficulty of Paying Living Expenses: Not hard at all  Food Insecurity: No Food Insecurity (01/01/2023)   Hunger Vital Sign   . Worried About Programme Researcher, Broadcasting/film/video in the Last Year: Never true   . Ran Out of Food in the Last Year: Never true  Transportation Needs: No Transportation Needs (01/01/2023)   PRAPARE - Transportation   . Lack of Transportation (Medical): No   . Lack of Transportation (Non-Medical): No    Allergies:  Codeine, Penicillins, and Sulfa (sulfonamide antibiotics)  Review of Systems: As per above. Pretty much unchanged with the exception that her breathing is improved. No associated cardiopulmonary, GI, GU, dermatological symptoms today. No focal neurological symptoms or psychological changes.   Physical Exam: BP 139/73   Pulse 85   Ht 165.1 cm (5' 5)   Wt 70.3 kg (155 lb)   SpO2 95%   BMI 25.79 kg/m  70.3 kg (155 lb) 95% General:  NAD. Able to speak in complete sentences without cough or dyspnea HEENT: Normocephalic, nontraumatic. Extraocular movements intact NECK: Supple. No JVD, nodes, thryomegaly CV: RRR no murmurs, gallops, rubs PULM: Normal respiratory effort, Clear to auscultation bilaterally without wheezing or crackles EXTREMITIESs: No significant edema, cyanosis or Homans'signs SKIN: Fair turgor. No rashes LYMPHATIC: No nodes NEURO: No gross deficits PSYCH: Appropriate affect   Diagnostics:  SPIROMETRY: FVC was 2.01 L, 72 % of predicted FEV1 was .91 L, 43 % of predicted FEV1/FVC ratio was  59 % of predicted FEF 25-75% liters  per second was 17 % of predicted Patient struggled to manage coughing during exhalation portion of FVC.    LUNG VOLUMES: TLC was 110 % of predicted RV was 159 % of predicted   DIFFUSION CAPACITY: DLCO was 100 % of predicted DLCO/VA was 114 % of predicted 2 Grade F efforts that were reproducible.    Good patient effort with fair repeatability.Patient states not feeling her best and notices a change in her SOBOE and fatigue.Wants to get sooner appt with Dr. Theotis.      Moderate obstruction on spiro Lung volumes show hyperinflation Diffussion capacity is in normal range   Interpreting Physician Dr. Theotis     SPIROMETRY: FVC was 2.32 liters, 83% of predicted FEV1 was 0.96, 45% of predicted FEV1 ratio was 41.59 FEF 25-75% liters per second was 15% of predicted   LUNG VOLUMES: TLC was 116% of predicted RV was 162% of  predicted   DIFFUSION CAPACITY: DLCO was 81% of predicted DLCO/VA was 111% of predicted   FLOW VOLUME LOOP: Expiratory flow volume    Impression Spirometry showed severe obstruction, trending up Lung volumes as expected showed hyperinflation/airtrapping Diffusion capacity is in normal range   Interpreting Physician Theotis Lavelle BRAVO, MD   Class Description - [facility] Comment    Comment:     Levels of Specific IgE       Class  Description of Class     ---------------------------  -----  --------------------                    < 0.10         0         Negative            0.10 -    0.31         0/I       Equivocal/Low            0.32 -    0.55         I         Low            0.56 -    1.40         II        Moderate            1.41 -    3.90         III       High            3.91 -   19.00         IV        Very High           19.01 -  100.00         V         Very High                   >100.00         VI        Very High   D001-IgE D pteronyssinus - [facility] Class 0 kU/L <0.10   D002-IgE D farinae Mite - [facility] Class 0 kU/L <0.10   E001-IgE Cat  Hair/Dander - [facility] Class 0 kU/L <0.10   E005-IgE Dog Hair/Dander - [facility] Class 0 kU/L <0.10   G002-IgE B[place] Grass - [facility] Class 0 kU/L <0.10   G008-IgE [place], [place]  - [facility] Class 0 kU/L <0.10   G010-IgE [place] Grass - [facility] Class 0 kU/L <0.10   G017-IgE [place] Grass - [facility] Class 0 kU/L <0.10   I206-IgE Cockroach, American - [facility] Class 0 kU/L <0.10   M001-IgE Penicillium chrysogen - [facility] Class 0 kU/L <0.10   M002-IgE Cladosporium herbarum - [facility] Class 0 kU/L <0.10   M003-IgE Aspergillus fumigatus - [facility] Class 0 kU/L <0.10   M004-IgE Mucor racemosus - [facility] Class 0 kU/L <0.10   M006-IgE Alternaria alternata - [facility] Class 0 kU/L <0.10   M010-IgE Stemphylium herbarum - [facility] Class 0 kU/L <0.10   T003-IgE Common Silver Valrie - [facility] Class 0 kU/L <0.10   T007-IgE Oak, White - [facility] Class 0 kU/L <0.10   T008-IgE Elm, American - [facility] Class 0 kU/L <0.10   T001-IgE Maple/Box Elder - [facility] Class 0 kU/L <0.10   T041-IgE Hickory, White - [facility] Class 0 kU/L <0.10   T070-IgE White Mulberry - [facility]  Class 0 kU/L <0.10   T006-IgE [place], [place] - [facility] Class 0 kU/L <0.10   W001-IgE Ragweed, Short - [facility] Class 0 kU/L <0.10   W009-IgE Plantain, English - [facility] Class 0 kU/L <0.10   W014-IgE Pigweed, Rough - [facility] Class 0 kU/L <0.10   W020-IgE Nettle - [facility] Class 0 kU/L <0.10  Resulting Agency [facility] <redacted file path>     Angio Convert Enzyme - [facility] 14 - 82 U/L 21    RA Latex Turbid. - [facility] <14.0 IU/mL <10.0    ANA Direct - [facility] Negative Negative     Histoplasma Mycelial CF Ab. - [facility] Neg:<1:2 Negative   Histoplasma Yeast CF Ab - [facility] Neg:<1:2 Negative   Histoplasma Mycelial ID Ab. - [facility] Negative Negative    Anti-MPO Antibodies - [facility] 0.0 - 0.9 units <0.2     Anti-PR3 Antibodies - [facility] 0.0 - 0.9 units <0.2   Gytoplasmic (C-ANCA) - [facility] Neg:<1:20 titer <1:20   Perinuclear  (P-ANCA) - [facility] Neg:<1:20 titer <1:20    A.Fumigatus #1 Abs - [facility] Negative Negative     Micropolyspora faeni, IgG - [facility] Negative Negative   Thermoactinomyces vulgaris, IgG - [facility] Negative Negative   A. Pullulans Abs - [facility] Negative Negative   Thermoact. Saccharii - [facility] Negative Negative   Pigeon Serum Abs - [facility] Negative Negative      Hemoglobin 12.0 - 15.0 g/dL 89.5 Low   HCT 63.9 - 53.9 % 32.8 Low       Creatinine 0.44 - 1.00 mg/dL 9.41  Calcium 8.9 - 89.6 mg/dL 9.4  GFR, Estimated > Iron 28 - 170 ug/dL 864  TIBC 749 - 549 ug/dL 558  Saturation Ratios 10.4 - 31.8 % 31  UIBC ug/dL 693  60 mL/min >39    Glucose-Capillary 70 - 99 mg/dL 90    Hemoglobin J8R 4.2 - 5.6 % 6.1 High       COMPARISON:  PET-CT March 08, 2023 and CT chest February 27, 2023  FINDINGS: Cardiovascular: No significant vascular findings. Normal heart size. No pericardial effusion. Diffuse coronary artery calcifications. No pericardial effusions  Mediastinum/Nodes: No enlarged mediastinal, hilar, or axillary lymph nodes. Thyroid gland, trachea, and esophagus demonstrate no significant findings.  Lungs/Pleura: Stable 4 mm irregular nodule in the anterior segment of the right upper lobe image 39 stable 2 mm nodule in the medial margin of the left upper lobe image 42 stable 3 mm nodule adjacent lateral to the aortic arch on image 61  Stable 3 mm nodule in the left upper lobe image 49  Stable 2 mm subpleural nodule superior segment left lower lobe image 53  Stable 9 mm nodule in the superior segment of the left lower lobe adjacent to the descending thoracic aorta image 65. Stable 5 mm nodule of the right lower lobe image 70.  Stable 6 mm right lower lobe nodule image 100 multiple other smaller sub 5 mm pulmonary nodules throughout both lungs are also stable since prior examination. Lung-RADS 2, benign appearance or behavior. Continue annual screening  with low-dose chest CT without contrast in 12 months.  Upper Abdomen: No acute abnormality.  Small hiatal hernia  Musculoskeletal: No chest wall abnormality. No acute or significant osseous findings.  IMPRESSION: *Multiple stable pulmonary nodules as described above. Lung-RADS 2, benign appearance or behavior. Continue annual screening with low-dose chest CT without contrast in 12 months. *Small hiatal hernia. *Diffuse coronary artery calcifications.   Electronically Signed  By: Franky Chard M.D.  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 Part of the decrement in lung function is due to lost of parenchyma due to the surgery. mild anemia, heme aware. Rhinitis. Stable.   -continue trelegy 100 one puff q day, albuterol 2 puffs qid, singulair 10 mg qhs  -tessalon 200 mg tid # 30 pills -flutter valve q tid -watch the weight -return in 16 weeks    Bilateral border line pet positive nodules, repeat chest ct showed no change.   -following with oncology next week -recommend  chest ct in 03/11/24.    Nocturia, treated for uti, fragmented sleep contributing to her tiredness Hbaic 6.1 Limit nocturnal liquids   Diffuse coronary artery calcifications. Cardiology visit this month

## 2024-02-29 ENCOUNTER — Inpatient Hospital Stay: Admitting: Internal Medicine

## 2024-02-29 ENCOUNTER — Inpatient Hospital Stay

## 2024-03-06 ENCOUNTER — Encounter: Payer: Self-pay | Admitting: Internal Medicine

## 2024-03-06 ENCOUNTER — Inpatient Hospital Stay: Admitting: Internal Medicine

## 2024-03-06 ENCOUNTER — Inpatient Hospital Stay: Attending: Internal Medicine

## 2024-03-06 VITALS — BP 140/69 | HR 72 | Temp 98.2°F | Resp 18 | Ht 66.0 in | Wt 150.8 lb

## 2024-03-06 DIAGNOSIS — Z9011 Acquired absence of right breast and nipple: Secondary | ICD-10-CM | POA: Insufficient documentation

## 2024-03-06 DIAGNOSIS — D649 Anemia, unspecified: Secondary | ICD-10-CM | POA: Diagnosis not present

## 2024-03-06 DIAGNOSIS — Z85118 Personal history of other malignant neoplasm of bronchus and lung: Secondary | ICD-10-CM | POA: Insufficient documentation

## 2024-03-06 DIAGNOSIS — R918 Other nonspecific abnormal finding of lung field: Secondary | ICD-10-CM | POA: Diagnosis not present

## 2024-03-06 LAB — FERRITIN: Ferritin: 51 ng/mL (ref 11–307)

## 2024-03-06 LAB — IRON AND TIBC
Iron: 71 ug/dL (ref 28–170)
Saturation Ratios: 16 % (ref 10.4–31.8)
TIBC: 442 ug/dL (ref 250–450)
UIBC: 372 ug/dL

## 2024-03-06 LAB — CBC WITH DIFFERENTIAL (CANCER CENTER ONLY)
Abs Immature Granulocytes: 0.03 K/uL (ref 0.00–0.07)
Basophils Absolute: 0 K/uL (ref 0.0–0.1)
Basophils Relative: 0 %
Eosinophils Absolute: 0.1 K/uL (ref 0.0–0.5)
Eosinophils Relative: 3 %
HCT: 33.3 % — ABNORMAL LOW (ref 36.0–46.0)
Hemoglobin: 11.1 g/dL — ABNORMAL LOW (ref 12.0–15.0)
Immature Granulocytes: 1 %
Lymphocytes Relative: 29 %
Lymphs Abs: 1.4 K/uL (ref 0.7–4.0)
MCH: 28.3 pg (ref 26.0–34.0)
MCHC: 33.3 g/dL (ref 30.0–36.0)
MCV: 84.9 fL (ref 80.0–100.0)
Monocytes Absolute: 0.5 K/uL (ref 0.1–1.0)
Monocytes Relative: 11 %
Neutro Abs: 2.7 K/uL (ref 1.7–7.7)
Neutrophils Relative %: 56 %
Platelet Count: 349 K/uL (ref 150–400)
RBC: 3.92 MIL/uL (ref 3.87–5.11)
RDW: 12.9 % (ref 11.5–15.5)
WBC Count: 4.9 K/uL (ref 4.0–10.5)
nRBC: 0 % (ref 0.0–0.2)

## 2024-03-06 LAB — CMP (CANCER CENTER ONLY)
ALT: 14 U/L (ref 0–44)
AST: 23 U/L (ref 15–41)
Albumin: 4.3 g/dL (ref 3.5–5.0)
Alkaline Phosphatase: 24 U/L — ABNORMAL LOW (ref 38–126)
Anion gap: 10 (ref 5–15)
BUN: 16 mg/dL (ref 8–23)
CO2: 25 mmol/L (ref 22–32)
Calcium: 9.4 mg/dL (ref 8.9–10.3)
Chloride: 100 mmol/L (ref 98–111)
Creatinine: 0.62 mg/dL (ref 0.44–1.00)
GFR, Estimated: >60 mL/min (ref >60)
Glucose, Bld: 92 mg/dL (ref 70–99)
Potassium: 3.8 mmol/L (ref 3.5–5.1)
Sodium: 135 mmol/L (ref 135–145)
Total Bilirubin: 0.6 mg/dL (ref 0.0–1.2)
Total Protein: 7.1 g/dL (ref 6.5–8.1)

## 2024-03-06 LAB — LACTATE DEHYDROGENASE: LDH: 132 U/L (ref 105–235)

## 2024-03-06 NOTE — Progress Notes (Signed)
  [facility]cer Center CONSULT NOTE  Patient Care Team: Auston Reyes BIRCH, MD as PCP - General (Internal Medicine) Rennie Cindy SAUNDERS, MD as Consulting Physician (Oncology) Verdene Gills, RN as Oncology Nurse Navigator  CHIEF COMPLAINTS/PURPOSE OF CONSULTATION: ANEMIA  HEMATOLOGY HISTORY  # 1990- left breast cancer- s/p lumpectomy; chemo- RT; no pills [ER- NEG]; [facility] Phlegm.   # Right Upper Lung cancer [2000] s/p surgery- [facility] Phlegm- No chemo- RT [Stage I]; remote smoking.  # ANEMIA[Hb; MCV-platelets- WBC; Iron sat; ferritin;  GFR- CT/US ; EGD- none/colonoscopy-2023-Feb- NEG-     Latest Reference Range & Units 04/03/22 12:05  Kappa free light chain 3.3 - 19.4 mg/L 15.0  Lambda free light chains 5.7 - 26.3 mg/L 6.6  Kappa, lambda light chain ratio 0.26 - 1.65  2.27 (H)  (H): Data is abnormally high   Latest Reference Range & Units 04/03/22 12:05  LDH 98 - 192 U/L 118  Iron 28 - 170 ug/dL 59  UIBC ug/dL 676  TIBC 749 - 549 ug/dL 617  Saturation Ratios 10.4 - 31.8 % 15  Ferritin 11 - 307 ng/mL 23  Folate >5.9 ng/mL 13.9  Vitamin B12 180 - 914 pg/mL 594   HISTORY OF PRESENTING ILLNESS: Alone.  Ambulating independently.   Monica Baxter 78 y.o.  female pleasant patient with mild anemia noted to have incidental lung nodules is here to follow-up.  Discussed the use of AI scribe software for clinical note transcription with the patient, who gave verbal consent to proceed.  History of Present Illness   The patient presents for follow-up of mild anemia and multiple lung nodules/mild anemia.  The patient is concerned about their red blood cell count and inquires if their numbers are stable. Their hemoglobin level is currently 11.1. Hemoglobin levels have fluctuated between 10 and 11 over the past four years, with a previous low of 10.4. They express relief that their numbers are not decreasing further.  Patient is currently awaiting evaluation with Dr. Theotis awaiting a  CT scan next week.      Review of Systems  Constitutional:  Positive for malaise/fatigue. Negative for chills, diaphoresis, fever and weight loss.  HENT:  Negative for nosebleeds and sore throat.   Eyes:  Negative for double vision.  Respiratory:  Negative for hemoptysis, sputum production, shortness of breath and wheezing.   Cardiovascular:  Negative for chest pain, palpitations, orthopnea and leg swelling.  Gastrointestinal:  Negative for abdominal pain, blood in stool, constipation, diarrhea, heartburn, melena, nausea and vomiting.  Genitourinary:  Negative for dysuria, frequency and urgency.  Musculoskeletal:  Negative for back pain and joint pain.  Skin: Negative.  Negative for itching and rash.  Neurological:  Negative for dizziness, tingling, focal weakness, weakness and headaches.  Endo/Heme/Allergies:  Does not bruise/bleed easily.  Psychiatric/Behavioral:  Negative for depression. The patient is not nervous/anxious and does not have insomnia.     MEDICAL HISTORY:  Past Medical History:  Diagnosis Date   Anemia    Breast cancer (HCC) 1990   left   Cancer (HCC)    breast    Hypertension    Personal history of chemotherapy    Personal history of radiation therapy     SURGICAL HISTORY: Past Surgical History:  Procedure Laterality Date   APPENDECTOMY  2015   BREAST BIOPSY     BREAST EXCISIONAL BIOPSY     BREAST LUMPECTOMY Left 1990   CHOLECYSTECTOMY  1995   COLONOSCOPY  2017   HERNIA REPAIR  2016  umbilical    LOBECTOMY Bilateral 2002,2015    SOCIAL HISTORY: Social History   Socioeconomic History   Marital status: Married    Spouse name: Not on file   Number of children: Not on file   Years of education: Not on file   Highest education level: Not on file  Occupational History   Not on file  Tobacco Use   Smoking status: Former    Current packs/day: 0.00    Types: Cigarettes    Quit date: 69    Years since quitting: 43.8   Smokeless tobacco: Never   Substance and Sexual Activity   Alcohol use: Yes    Alcohol/week: 2.0 standard drinks of alcohol    Types: 2 Glasses of wine per week   Drug use: Never   Sexual activity: Not on file  Other Topics Concern   Not on file  Social History Narrative   Not on file   Social Drivers of Health   Financial Resource Strain: Low Risk  (01/01/2023)   Received from [facility]   Overall Financial Resource Strain (CARDIA)    Difficulty of Paying Living Expenses: Not hard at all  Food Insecurity: No Food Insecurity (01/01/2023)   Received from [facility]   Hunger Vital Sign    Within the past 12 months, you worried that your food would run out before you got the money to buy more.: Never true    Within the past 12 months, the food you bought just didn't last and you didn't have money to get more.: Never true  Transportation Needs: No Transportation Needs (01/01/2023)   Received from [facility] - Transportation    In the past 12 months, has lack of transportation kept you from medical appointments or from getting medications?: No    Lack of Transportation (Non-Medical): No  Physical Activity: Not on file  Stress: Not on file  Social Connections: Not on file  Intimate Partner Violence: Not on file    FAMILY HISTORY: Family History  Adopted: Yes  Family history unknown: Yes    ALLERGIES:  is allergic to penicillins, codeine, and hydromorphone hcl.  MEDICATIONS:  Current Outpatient Medications  Medication Sig Dispense Refill   albuterol (VENTOLIN HFA) 108 (90 Baxter) MCG/ACT inhaler Inhale 2 puffs into the lungs daily.     amLODipine (NORVASC) 10 MG tablet Take 10 mg by mouth daily.     Fluticasone-Umeclidin-Vilant (TRELEGY ELLIPTA) 100-62.5-25 MCG/ACT AEPB Inhale 1 puff into the lungs daily.     gabapentin (NEURONTIN) 600 MG tablet Take by mouth.     omeprazole (PRILOSEC) 20 MG capsule Take by mouth.     raloxifene (EVISTA) 60  MG tablet      solifenacin (VESICARE) 5 MG tablet      Cholecalciferol (VITAMIN D3) 1000 units CAPS Take 1 tablet by mouth daily. (Patient not taking: Reported on 03/06/2024)     cyanocobalamin  (VITAMIN B12) 1000 MCG tablet Take 1,000 mcg by mouth daily. (Patient not taking: Reported on 03/06/2024)     No current facility-administered medications for this visit.     SABRA  PHYSICAL EXAMINATION:   Vitals:   03/06/24 0950  BP: (!) 140/69  Pulse: 72  Resp: 18  Temp: 98.2 F (36.8 C)  SpO2: 97%      Filed Weights   03/06/24 0950  Weight: 150 lb 12.8 oz (68.4 kg)      Physical Exam Vitals and nursing note reviewed.  HENT:     Head: Normocephalic and atraumatic.     Mouth/Throat:     Pharynx: Oropharynx is clear.  Eyes:     Extraocular Movements: Extraocular movements intact.     Pupils: Pupils are equal, round, and reactive to light.  Cardiovascular:     Rate and Rhythm: Normal rate and regular rhythm.  Pulmonary:     Comments: Decreased breath sounds bilaterally.  Abdominal:     Palpations: Abdomen is soft.  Musculoskeletal:        General: Normal range of motion.     Cervical back: Normal range of motion.  Skin:    General: Skin is warm.  Neurological:     General: No focal deficit present.     Mental Status: She is alert and oriented to person, place, and time.  Psychiatric:        Behavior: Behavior normal.        Judgment: Judgment normal.      LABORATORY DATA:  I have reviewed the data as listed Lab Results  Component Value Date   WBC 4.9 03/06/2024   HGB 11.1 (L) 03/06/2024   HCT 33.3 (L) 03/06/2024   MCV 84.9 03/06/2024   PLT 349 03/06/2024   Recent Labs    03/06/24 0925  NA 135  K 3.8  CL 100  CO2 25  GLUCOSE 92  BUN 16  CREATININE 0.62  CALCIUM 9.4  GFRNONAA >60  PROT 7.1  ALBUMIN 4.3  AST 23  ALT 14  ALKPHOS 24*  BILITOT 0.6     No results found.   ASSESSMENT & PLAN:   Multiple lung nodules # Incidental CT-March 2024  [Dr.Fleming-COPD] Subcentimeter lung nodules [chronic but slowly growing-compared to 2020 CT scan ] - APRIL 2024-PET scan negative for any uptake-which is reassuring.  However increase in size of Central left lower lobe 0.7 cm solid pulmonary nodule [in 2020 being 3 mm]; other stable lung nodules bilaterally. - NOV 11th, 2024- Multiple bilateral lung nodules. There are are few nodule which has slightly increased in size such as left lower lobe nodule seen previously now measuring 9 mm as opposed to previous measurement of 7 mm. There is 1 nodule in particular in the right upper lobe which is significantly increased in size and has a more ground-glass and spiculated now measuring 7 mm and previously 3 mm. This has more worrisome for potential neoplasm. 03/21/2023- PET scan: Improving right middle lobe and lingular peri-bronchovascular nodularity, indicative of an atypical infectious process.  Additional scattered borderline hypermetabolic well-circumscribed hematogenously distributed pulmonary nodules are worrisome for metastatic disease. CT scan MARCH 2025-overall stable lung nodules.    # Await CT scan -next week.  Would recommend continue follow-up/with [facility], Dr. Theotis on December 4th.   # Mild Anemia [Hb 11.5-12; since 2021] normal MCV- ? Symptomatic- mild fatigue; DEC 2023-iron saturation 15%: Ferritin 26.  B12/folic acid /M protein negative; kappa lambda light chain ratio slightly abnormal at 2.6.   Hb today- 11.2- over all stable.  Hold off any further evaluation including bone marrow at this time.  # Hx of lumpectomy- mastectomy + ALND [1990]- No evidence of any Lymphedema/IV access;  Low clinical concerns for any recurrent malignancy.  However monitor closely with upper lung nodules.- stable.  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 Low clinical concerns for any recurrent malignancy- stable. See  scan above.  CT scan with Dr.Fleming- on 11/18.   # DISPOSITION: # follow up TBD- Dr.B   Cindy JONELLE Joe, MD 03/06/2024 11:16 AM

## 2024-03-06 NOTE — Progress Notes (Signed)
 Fatigue/weakness: NO  Dyspena: YES/COPD Light headedness: NO  Blood in stool: NO

## 2024-03-06 NOTE — Assessment & Plan Note (Addendum)
#   Incidental CT-March 2024 [Dr.Fleming-COPD] Subcentimeter lung nodules [chronic but slowly growing-compared to 2020 CT scan ] - APRIL 2024-PET scan negative for any uptake-which is reassuring.  However increase in size of Central left lower lobe 0.7 cm solid pulmonary nodule [in 2020 being 3 mm]; other stable lung nodules bilaterally. - NOV 11th, 2024- Multiple bilateral lung nodules. There are are few nodule which has slightly increased in size such as left lower lobe nodule seen previously now measuring 9 mm as opposed to previous measurement of 7 mm. There is 1 nodule in particular in the right upper lobe which is significantly increased in size and has a more ground-glass and spiculated now measuring 7 mm and previously 3 mm. This has more worrisome for potential neoplasm. 03/21/2023- PET scan: Improving right middle lobe and lingular peri-bronchovascular nodularity, indicative of an atypical infectious process.  Additional scattered borderline hypermetabolic well-circumscribed hematogenously distributed pulmonary nodules are worrisome for metastatic disease. CT scan MARCH 2025-overall stable lung nodules.    # Await CT scan -next week.  Would recommend continue follow-up/with [facility], Dr. Theotis on December 4th.   # Mild Anemia [Hb 11.5-12; since 2021] normal MCV- ? Symptomatic- mild fatigue; DEC 2023-iron saturation 15%: Ferritin 26.  B12/folic acid /M protein negative; kappa lambda light chain ratio slightly abnormal at 2.6.   Hb today- 11.2- over all stable.  Hold off any further evaluation including bone marrow at this time.  # Hx of lumpectomy- mastectomy + ALND [1990]- No evidence of any Lymphedema/IV access;  Low clinical concerns for any recurrent malignancy.  However monitor closely with upper lung nodules.- stable.  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 Low clinical concerns for any  recurrent malignancy- stable. See scan above.   CT scan with Dr.Fleming- on 11/18.   # DISPOSITION: # follow up TBD- Dr.B

## 2024-03-11 ENCOUNTER — Ambulatory Visit
Admission: RE | Admit: 2024-03-11 | Discharge: 2024-03-11 | Disposition: A | Discharge status: Home / Self Care | Source: Ambulatory Visit | Attending: Specialist | Admitting: Specialist

## 2024-03-11 DIAGNOSIS — R918 Other nonspecific abnormal finding of lung field: Secondary | ICD-10-CM | POA: Insufficient documentation

## 2024-03-11 DIAGNOSIS — R0609 Other forms of dyspnea: Secondary | ICD-10-CM | POA: Insufficient documentation

## 2024-03-22 NOTE — Progress Notes (Signed)
 03/24/2024 9:52 PM   Monica Baxter 1945/06/12 [id number]  Referring provider: Auston Reyes BIRCH, MD [address] [facility] [place],  [place] [address]  Urological history: 1. 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 SUI  Chief Complaint  Patient presents with   Urinary Incontinence   Over Active Bladder   HPI: Monica Baxter is a 78 y.o. woman who presents today for urinary incontinence.  Previous records reviewed.  She is having 1 to 7 daytime voids, nocturia x 1-2 and a strong urge to void.  She is having both stress and urge incontinence.  She states she dribbles urine before she can get to the restroom on time.  She is leaking 1 to 2 times daily.  She is wearing 2 to 3 panty liners daily.  She is not limiting her fluids.  She does engage in toilet mapping.  These symptoms have been occurring for 6 months or more.  She has been on Vesicare 5 mg daily for two years.  She cannot take it every day as it make it more difficult for her to void.    She had been on antibiotics for an URI and noticed improvement in her frequency.    Patient denies any modifying or aggravating factors.  Patient denies any recent UTI's, gross hematuria, dysuria or suprapubic/flank pain.  Patient denies any fevers, chills, nausea or vomiting.    UA yellow clear, specific gravity 1.025, pH 6.0, 0-5 WBCs, 0-10 epithelial cells and few bacteria  PVR 0 mL  Serum creatinine 0.62  Hemoglobin A1c (08/2023) 6.1   Fluid consumption She drinks a lot of water.  She has two cups of coffee daily.    PMH: Past Medical History:  Diagnosis Date   Anemia    Breast cancer (HCC) 1990   left   Cancer (HCC)    breast    Hypertension    Personal history of chemotherapy    Personal history of radiation therapy     Surgical History: Past Surgical History:  Procedure Laterality Date   APPENDECTOMY  2015   BREAST BIOPSY     BREAST EXCISIONAL BIOPSY     BREAST LUMPECTOMY Left 1990    CHOLECYSTECTOMY  1995   COLONOSCOPY  2017   HERNIA REPAIR  2016   umbilical    LOBECTOMY Bilateral 2002,2015    Home Medications:  Allergies as of 03/24/2024       Reactions   Penicillins Other (See Comments), Nausea And Vomiting   Unknown reaction as a child   Codeine Itching   Within a day after initiating Tylenol 3 for cough suppression, she developed nasal itching and some itching in the central facial area. Within a day after initiating Tylenol 3 for cough suppression, she developed nasal itching and some itching in the central facial area.   Hydromorphone Hcl Itching        Medication List        Accurate as of March 24, 2024 11:59 PM. If you have any questions, ask your nurse or doctor.          albuterol 108 (90 Base) MCG/ACT inhaler Commonly known as: VENTOLIN HFA Inhale 2 puffs into the lungs daily.   amLODipine 10 MG tablet Commonly known as: NORVASC Take 10 mg by mouth daily.   cyanocobalamin  1000 MCG tablet Commonly known as: VITAMIN B12 Take 1,000 mcg by mouth daily.   gabapentin 600 MG tablet Commonly known as: NEURONTIN Take by mouth.  omeprazole 20 MG capsule Commonly known as: PRILOSEC Take by mouth.   raloxifene 60 MG tablet Commonly known as: EVISTA   solifenacin 5 MG tablet Commonly known as: VESICARE   Trelegy Ellipta 100-62.5-25 MCG/ACT Aepb Generic drug: Fluticasone-Umeclidin-Vilant Inhale 1 puff into the lungs daily.   Vitamin D3 1000 units Caps Take 1 tablet by mouth daily.        Allergies:  Allergies  Allergen Reactions   Penicillins Other (See Comments) and Nausea And Vomiting    Unknown reaction as a child    Codeine Itching    Within a day after initiating Tylenol 3 for cough suppression, she developed nasal itching and some itching in the central facial area. Within a day after initiating Tylenol 3 for cough suppression, she developed nasal itching and some itching in the central facial area.     Hydromorphone Hcl Itching    Family History: Family History  Adopted: Yes  Family history unknown: Yes    Social History:  reports that she quit smoking about 43 years ago. Her smoking use included cigarettes. She has never used smokeless tobacco. She reports current alcohol use of about 2.0 standard drinks of alcohol per week. She reports that she does not use drugs.  ROS: Pertinent ROS in HPI  Physical Exam: BP 115/70   Pulse 91   Ht 5' 6 (1.676 m)   Wt 152 lb (68.9 kg)   SpO2 95%   BMI 24.53 kg/m   Constitutional:  Well nourished. Alert and oriented, No acute distress. HEENT: [place] AT, moist mucus membranes.  Trachea midline Cardiovascular: No clubbing, cyanosis, or edema. Respiratory: Normal respiratory effort, no increased work of breathing. GU: No CVA tenderness.  No bladder fullness or masses.  Recession of labia minora, dry, pale vulvar vaginal mucosa and loss of mucosal ridges and folds.  Normal urethral meatus, no lesions, no prolapse, no discharge.   No urethral masses, tenderness and/or tenderness. No bladder fullness, tenderness or masses. Pale vagina mucosa, poor estrogen effect, no discharge, no lesions, fair pelvic support, grade I cystocele and no rectocele noted.  Anus and perineum are without rashes or lesions.     Neurologic: Grossly intact, no focal deficits, moving all 4 extremities. Psychiatric: Normal mood and affect.    Laboratory Data: See Epic and HPI   I have reviewed the labs.   Pertinent Imaging:  03/24/24 15:01  Scan Result 0mL    Assessment & Plan:    1. Mixed incontinence - Behavioral: Initiate bladder retraining and timed voiding and pelvic floor physical therapy referral for Kegel exercises and biofeedback  - Pharmacologic: continue Vesicare 5 mg every other day for urgency component and she does not want to try vaginal estrogen cream (estradiol 0.01%) 2x/week for urogenital atrophy because of her history of breast cancer  - Follow-up:  Return in 8-12 weeks to assess response to therapy, consider urodynamic testing if symptoms persist or worsen  - refer to discuss surgical options (e.g., midurethral sling) if stress incontinence remains bothersome despite conservative measures  - Education: Discussed nature of mixed incontinence, treatment options, and expected outcomes and patient expressed understanding and interest in conservative management initially   Return for refer to pelvic floor therapy .  These notes generated with voice recognition software. I apologize for typographical errors.  Monica Baxter  [facility] [address]  [address] [place], [place] [address] [phone]

## 2024-03-24 ENCOUNTER — Ambulatory Visit: Admitting: Urology

## 2024-03-24 VITALS — BP 115/70 | HR 91 | Ht 66.0 in | Wt 152.0 lb

## 2024-03-24 DIAGNOSIS — N3946 Mixed incontinence: Secondary | ICD-10-CM

## 2024-03-24 LAB — BLADDER SCAN AMB NON-IMAGING

## 2024-03-24 NOTE — Patient Instructions (Signed)
 [facility] PT [phone]

## 2024-03-25 LAB — URINALYSIS, COMPLETE
Bilirubin, UA: NEGATIVE
Glucose, UA: NEGATIVE
Ketones, UA: NEGATIVE
Leukocytes,UA: NEGATIVE
Nitrite, UA: NEGATIVE
Protein,UA: NEGATIVE
RBC, UA: NEGATIVE
Specific Gravity, UA: 1.025 (ref 1.005–1.030)
Urobilinogen, Ur: 0.2 mg/dL (ref 0.2–1.0)
pH, UA: 6 (ref 5.0–7.5)

## 2024-03-25 LAB — MICROSCOPIC EXAMINATION: RBC, Urine: NONE SEEN /HPF (ref 0–2)

## 2024-03-28 ENCOUNTER — Encounter: Payer: Self-pay | Admitting: Urology

## 2024-04-10 ENCOUNTER — Encounter: Payer: Self-pay | Admitting: Radiation Oncology

## 2024-04-10 ENCOUNTER — Ambulatory Visit
Admission: RE | Admit: 2024-04-10 | Discharge: 2024-04-10 | Attending: Radiation Oncology | Admitting: Radiation Oncology

## 2024-04-10 VITALS — BP 143/73 | HR 81 | Temp 97.0°F | Resp 16 | Ht 66.0 in | Wt 151.6 lb

## 2024-04-10 DIAGNOSIS — J449 Chronic obstructive pulmonary disease, unspecified: Secondary | ICD-10-CM | POA: Diagnosis not present

## 2024-04-10 DIAGNOSIS — Z79899 Other long term (current) drug therapy: Secondary | ICD-10-CM | POA: Diagnosis not present

## 2024-04-10 DIAGNOSIS — I10 Essential (primary) hypertension: Secondary | ICD-10-CM | POA: Insufficient documentation

## 2024-04-10 DIAGNOSIS — R918 Other nonspecific abnormal finding of lung field: Secondary | ICD-10-CM | POA: Insufficient documentation

## 2024-04-10 DIAGNOSIS — Z87891 Personal history of nicotine dependence: Secondary | ICD-10-CM | POA: Insufficient documentation

## 2024-04-10 NOTE — Consult Note (Signed)
 NEW PATIENT EVALUATION  Name: Monica Baxter  MRN: [id number]  Date:   04/10/2024     DOB: 1945-10-12   This 78 y.o. 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   PREVIOUS INVESTIGATIONS:  PET/CT serial CT scans reviewed Labs reviewed Clinical notes reviewed  HPI: Patient is a 78 year old female with a history of multiple lung cancers in the past.  She is status post right upper lobectomy as well as a partial left upper lobe resection for non-small cell lung cancer.  Dr. Theotis has been tracking a progressive lesion in her left lower lobe.  Lesion has now grown to 1 x 1.4 cm demonstrating measurable growth over time as compared to prior studies.  She also has numerous other stable bilateral sub-6 mm pulmonary nodules which are likely benign.  She has stable postsurgical changes in the partial left upper lobe as well as right upper lobectomy.  PET scan from a year ago showed the 8 mm left lower lobe lesion had an SUV max of 2.0.  No evidence of mediastinal or hilar involvement.  Patient has multiple medical comorbidities including COPD dyspnea on exertion.  Her most recent pulm pulmonary testing showed an FEV1 of 43% of predicted and an FEV1/F VC 59% of predicted.  This demonstrates moderate obstruction on spirometry.  She is now referred to radiation oncology for consideration of SBRT for the progressive left lower lobe lesion.  She is having no hemoptysis no chest pain.  PLANNED TREATMENT REGIMEN: SBRT  PAST MEDICAL HISTORY:  has a past medical history of Anemia, Breast cancer (HCC) (1990), Cancer (HCC), Hypertension, Personal history of chemotherapy, and Personal history of  radiation therapy.    PAST SURGICAL HISTORY:  Past Surgical History:  Procedure Laterality Date   APPENDECTOMY  2015   BREAST BIOPSY     BREAST EXCISIONAL BIOPSY     BREAST LUMPECTOMY Left 1990   CHOLECYSTECTOMY  1995   COLONOSCOPY  2017   HERNIA REPAIR  2016   umbilical    LOBECTOMY Bilateral 2002,2015    FAMILY HISTORY: She was adopted. Family history is unknown by patient.  SOCIAL HISTORY:  reports that she quit smoking about 43 years ago. Her smoking use included cigarettes. She has never used smokeless tobacco. She reports current alcohol use of about 2.0 standard drinks of alcohol per week. She reports that she does not use drugs.  ALLERGIES: Penicillins, Codeine, and Hydromorphone hcl  MEDICATIONS:  Current Outpatient Medications  Medication Sig Dispense Refill   albuterol (VENTOLIN HFA) 108 (90 Base) MCG/ACT inhaler Inhale 2 puffs into the lungs daily.     amLODipine (NORVASC) 10 MG tablet Take 10 mg by mouth daily.     Cholecalciferol (VITAMIN D3) 1000 units CAPS Take 1 tablet by mouth daily. (Patient not taking: Reported on 03/24/2024)     cyanocobalamin  (VITAMIN B12) 1000 MCG tablet Take 1,000 mcg by mouth daily. (Patient not taking: Reported on 03/24/2024)     Fluticasone-Umeclidin-Vilant (TRELEGY ELLIPTA) 100-62.5-25 MCG/ACT AEPB Inhale 1 puff into the lungs daily.     gabapentin (NEURONTIN) 600 MG tablet Take by mouth.     omeprazole (PRILOSEC) 20 MG capsule Take by mouth.  raloxifene (EVISTA) 60 MG tablet      solifenacin (VESICARE) 5 MG tablet      No current facility-administered medications for this encounter.    ECOG PERFORMANCE STATUS:  0 - Asymptomatic  REVIEW OF SYSTEMS: Patient denies any weight loss, fatigue, weakness, fever, chills or night sweats. Patient denies any loss of vision, blurred vision. Patient denies any ringing  of the ears or hearing loss. No irregular heartbeat. Patient denies heart murmur or history of fainting. Patient denies any  chest pain or pain radiating to her upper extremities. Patient denies any shortness of breath, difficulty breathing at night, cough or hemoptysis. Patient denies any swelling in the lower legs. Patient denies any nausea vomiting, vomiting of blood, or coffee ground material in the vomitus. Patient denies any stomach pain. Patient states has had normal bowel movements no significant constipation or diarrhea. Patient denies any dysuria, hematuria or significant nocturia. Patient denies any problems walking, swelling in the joints or loss of balance. Patient denies any skin changes, loss of hair or loss of weight. Patient denies any excessive worrying or anxiety or significant depression. Patient denies any problems with insomnia. Patient denies excessive thirst, polyuria, polydipsia. Patient denies any swollen glands, patient denies easy bruising or easy bleeding. Patient denies any recent infections, allergies or URI. Patient s visual fields have not changed significantly in recent time.   PHYSICAL EXAM: BP (!) 143/73   Pulse 81   Temp (!) 97 F (36.1 C)   Resp 16   Ht 5' 6 (1.676 m)   Wt 151 lb 9.6 oz (68.8 kg)   SpO2 99%   BMI 24.47 kg/m  Well-developed well-nourished patient in NAD. HEENT reveals PERLA, EOMI, discs not visualized.  Oral cavity is clear. No oral mucosal lesions are identified. Neck is clear without evidence of cervical or supraclavicular adenopathy. Lungs are clear to A&P. Cardiac examination is essentially unremarkable with regular rate and rhythm without murmur rub or thrill. Abdomen is benign with no organomegaly or masses noted. Motor sensory and DTR levels are equal and symmetric in the upper and lower extremities. Cranial nerves II through XII are grossly intact. Proprioception is intact. No peripheral adenopathy or edema is identified. No motor or sensory levels are noted. Crude visual fields are within normal range.  LABORATORY DATA: Labs reviewed    RADIOLOGY RESULTS:  Serial CT scans and prior PET scan reviewed compatible with above-stated findings   IMPRESSION: Progressive presumed non-small cell lung cancer of left lower lobe in 78 year old female.  PLAN: At this time I have recommended SBRT to the left lower lobe lesion.  Will plan on delivering 54 Gray in 5 fractions.  Risks and benefits of treatment occluding extremely low side effect profile were reviewed with the patient.  She may develop a slight increased cough and possible fatigue.  Patient comprehends my recommendations well I personally set up and ordered CT simulation for next week.  I would like to take this opportunity to thank you for allowing me to participate in the care of your patient.SABRA Marcey Penton, MD

## 2024-04-14 ENCOUNTER — Ambulatory Visit
Admission: RE | Admit: 2024-04-14 | Discharge: 2024-04-14 | Attending: Radiation Oncology | Admitting: Radiation Oncology

## 2024-04-14 DIAGNOSIS — R918 Other nonspecific abnormal finding of lung field: Secondary | ICD-10-CM | POA: Insufficient documentation

## 2024-04-14 DIAGNOSIS — Z510 Encounter for antineoplastic radiation therapy: Secondary | ICD-10-CM | POA: Insufficient documentation

## 2024-05-02 DIAGNOSIS — Z85118 Personal history of other malignant neoplasm of bronchus and lung: Secondary | ICD-10-CM | POA: Insufficient documentation

## 2024-05-02 DIAGNOSIS — R911 Solitary pulmonary nodule: Secondary | ICD-10-CM | POA: Insufficient documentation

## 2024-05-02 DIAGNOSIS — Z510 Encounter for antineoplastic radiation therapy: Secondary | ICD-10-CM | POA: Insufficient documentation

## 2024-05-02 DIAGNOSIS — Z902 Acquired absence of lung [part of]: Secondary | ICD-10-CM | POA: Insufficient documentation

## 2024-05-06 ENCOUNTER — Ambulatory Visit
Admission: RE | Admit: 2024-05-06 | Discharge: 2024-05-06 | Disposition: A | Discharge status: Home / Self Care | Source: Ambulatory Visit | Attending: Radiation Oncology | Admitting: Radiation Oncology

## 2024-05-06 ENCOUNTER — Other Ambulatory Visit: Payer: Self-pay

## 2024-05-06 LAB — RAD ONC ARIA SESSION SUMMARY
Course Elapsed Days: 0
Plan Fractions Treated to Date: 1
Plan Prescribed Dose Per Fraction: 10.8 Gy
Plan Total Fractions Prescribed: 5
Plan Total Prescribed Dose: 54 Gy
Reference Point Dosage Given to Date: 10.8 Gy
Reference Point Session Dosage Given: 10.8 Gy
Session Number: 1

## 2024-05-08 ENCOUNTER — Other Ambulatory Visit: Payer: Self-pay

## 2024-05-08 ENCOUNTER — Ambulatory Visit
Admission: RE | Admit: 2024-05-08 | Discharge: 2024-05-08 | Attending: Radiation Oncology | Admitting: Radiation Oncology

## 2024-05-08 ENCOUNTER — Ambulatory Visit

## 2024-05-08 LAB — RAD ONC ARIA SESSION SUMMARY
Course Elapsed Days: 2
Plan Fractions Treated to Date: 2
Plan Prescribed Dose Per Fraction: 10.8 Gy
Plan Total Fractions Prescribed: 5
Plan Total Prescribed Dose: 54 Gy
Reference Point Dosage Given to Date: 21.6 Gy
Reference Point Session Dosage Given: 10.8 Gy
Session Number: 2

## 2024-05-13 ENCOUNTER — Ambulatory Visit
Admission: RE | Admit: 2024-05-13 | Discharge: 2024-05-13 | Disposition: A | Source: Ambulatory Visit | Attending: Radiation Oncology | Admitting: Radiation Oncology

## 2024-05-13 ENCOUNTER — Other Ambulatory Visit: Payer: Self-pay

## 2024-05-13 LAB — RAD ONC ARIA SESSION SUMMARY
Course Elapsed Days: 7
Plan Fractions Treated to Date: 3
Plan Prescribed Dose Per Fraction: 10.8 Gy
Plan Total Fractions Prescribed: 5
Plan Total Prescribed Dose: 54 Gy
Reference Point Dosage Given to Date: 32.4 Gy
Reference Point Session Dosage Given: 10.8 Gy
Session Number: 3

## 2024-05-15 ENCOUNTER — Other Ambulatory Visit: Payer: Self-pay

## 2024-05-15 ENCOUNTER — Ambulatory Visit
Admission: RE | Admit: 2024-05-15 | Discharge: 2024-05-15 | Disposition: A | Discharge status: Home / Self Care | Source: Ambulatory Visit | Attending: Radiation Oncology | Admitting: Radiation Oncology

## 2024-05-15 LAB — RAD ONC ARIA SESSION SUMMARY
Course Elapsed Days: 9
Plan Fractions Treated to Date: 4
Plan Prescribed Dose Per Fraction: 10.8 Gy
Plan Total Fractions Prescribed: 5
Plan Total Prescribed Dose: 54 Gy
Reference Point Dosage Given to Date: 43.2 Gy
Reference Point Session Dosage Given: 10.8 Gy
Session Number: 4

## 2024-05-20 ENCOUNTER — Ambulatory Visit

## 2024-05-21 ENCOUNTER — Other Ambulatory Visit: Payer: Self-pay

## 2024-05-21 ENCOUNTER — Ambulatory Visit
Admission: RE | Admit: 2024-05-21 | Discharge: 2024-05-21 | Attending: Radiation Oncology | Admitting: Radiation Oncology

## 2024-05-21 LAB — RAD ONC ARIA SESSION SUMMARY
Course Elapsed Days: 15
Plan Fractions Treated to Date: 5
Plan Prescribed Dose Per Fraction: 10.8 Gy
Plan Total Fractions Prescribed: 5
Plan Total Prescribed Dose: 54 Gy
Reference Point Dosage Given to Date: 54 Gy
Reference Point Session Dosage Given: 10.8 Gy
Session Number: 5

## 2024-05-23 NOTE — Radiation Completion Notes (Signed)
 Patient Name: Monica Baxter, Monica Baxter MRN: [id number] Date of Birth: 26-Apr-1945 Referring Physician: AUSTON REYES BIRCH, M.D. Date of Service: 2024-05-23 Radiation Oncologist: Marcey Penton, M.D. [facility]                             RADIATION ONCOLOGY END OF TREATMENT NOTE     Diagnosis: R91.8 Other nonspecific abnormal finding of lung field Intent: Curative     HPI: Patient is a 79 year old female with a history of multiple lung cancers in the past.  She is status post right upper lobectomy as well as a partial left upper lobe resection for non-small cell lung cancer.  Dr. Theotis has been tracking a progressive lesion in her left lower lobe.  Lesion has now grown to 1 x 1.4 cm demonstrating measurable growth over time as compared to prior studies.  She also has numerous other stable bilateral sub-6 mm pulmonary nodules which are likely benign.  She has stable postsurgical changes in the partial left upper lobe as well as right upper lobectomy.  PET scan from a year ago showed the 8 mm left lower lobe lesion had an SUV max of 2.0.  No evidence of mediastinal or hilar involvement.  Patient has multiple medical comorbidities including COPD dyspnea on exertion.  Her most recent pulm pulmonary testing showed an FEV1 of 43% of predicted and an FEV1/F VC 59% of predicted.  This demonstrates moderate obstruction on spirometry.  She is now referred to radiation oncology for consideration of SBRT for the progressive left lower lobe lesion.  She is having no hemoptysis no chest pain.      ==========DELIVERED PLANS==========  First Treatment Date: 2024-05-06 Last Treatment Date: 2024-05-21   Plan Name: Lung_L_SBRT Site: Lung, Left Technique: SBRT/SRT-IMRT Mode: Photon Dose Per Fraction: 10.8 Gy Prescribed Dose (Delivered / Prescribed): 54 Gy / 54 Gy Prescribed Fxs (Delivered / Prescribed): 5 / 5     ==========ON TREATMENT VISIT DATES========== 2024-05-06, 2024-05-08,  2024-05-13, 2024-05-13, 2024-05-15, 2024-05-21     ==========UPCOMING VISITS========== 06/18/2024 [facility] RAD ONCOLOGY FOLLOW UP 30 Chrystal, Glenn, MD        ==========APPENDIX - ON TREATMENT VISIT NOTES==========   See weekly On Treatment Notes in Epic for details in the Media tab (listed as Progress notes on the On Treatment Visit Dates listed above).

## 2024-06-18 ENCOUNTER — Ambulatory Visit: Admitting: Radiation Oncology
